# Patient Record
Sex: Male | Born: 1995
Health system: Southern US, Community
[De-identification: ages and names within clinical notes are randomized; demographics above are authoritative.]

## PROBLEM LIST (undated history)

## (undated) DIAGNOSIS — F845 Asperger's syndrome: Secondary | ICD-10-CM

## (undated) DIAGNOSIS — Z9989 Dependence on other enabling machines and devices: Secondary | ICD-10-CM

## (undated) DIAGNOSIS — R569 Unspecified convulsions: Secondary | ICD-10-CM

## (undated) DIAGNOSIS — G4733 Obstructive sleep apnea (adult) (pediatric): Secondary | ICD-10-CM

## (undated) DIAGNOSIS — R7303 Prediabetes: Secondary | ICD-10-CM

## (undated) DIAGNOSIS — F319 Bipolar disorder, unspecified: Secondary | ICD-10-CM

## (undated) DIAGNOSIS — F840 Autistic disorder: Secondary | ICD-10-CM

## (undated) HISTORY — PX: ADENOIDECTOMY: SUR15

## (undated) HISTORY — DX: Obstructive sleep apnea (adult) (pediatric): G47.33

## (undated) HISTORY — DX: Dependence on other enabling machines and devices: Z99.89

## (undated) HISTORY — DX: Prediabetes: R73.03

## (undated) HISTORY — PX: INNER EAR SURGERY: SHX679

---
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
## 2013-05-09 ENCOUNTER — Emergency Department (HOSPITAL_COMMUNITY)
Admission: EM | Admit: 2013-05-09 | Discharge: 2013-05-10 | Disposition: A | Discharge status: Home / Self Care | Payer: No Typology Code available for payment source | Attending: Emergency Medicine | Admitting: Emergency Medicine

## 2013-05-09 ENCOUNTER — Emergency Department (HOSPITAL_COMMUNITY): Payer: No Typology Code available for payment source

## 2013-05-09 ENCOUNTER — Encounter (HOSPITAL_COMMUNITY): Payer: Self-pay | Admitting: Adult Health

## 2013-05-09 DIAGNOSIS — Y92410 Unspecified street and highway as the place of occurrence of the external cause: Secondary | ICD-10-CM | POA: Insufficient documentation

## 2013-05-09 DIAGNOSIS — S5010XA Contusion of unspecified forearm, initial encounter: Secondary | ICD-10-CM | POA: Insufficient documentation

## 2013-05-09 DIAGNOSIS — F840 Autistic disorder: Secondary | ICD-10-CM | POA: Insufficient documentation

## 2013-05-09 DIAGNOSIS — F319 Bipolar disorder, unspecified: Secondary | ICD-10-CM | POA: Insufficient documentation

## 2013-05-09 DIAGNOSIS — S5011XA Contusion of right forearm, initial encounter: Secondary | ICD-10-CM

## 2013-05-09 DIAGNOSIS — Y9389 Activity, other specified: Secondary | ICD-10-CM | POA: Insufficient documentation

## 2013-05-09 DIAGNOSIS — R569 Unspecified convulsions: Secondary | ICD-10-CM | POA: Insufficient documentation

## 2013-05-09 DIAGNOSIS — Z79899 Other long term (current) drug therapy: Secondary | ICD-10-CM | POA: Insufficient documentation
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
## 2013-05-10 MED ORDER — HYDROCODONE-ACETAMINOPHEN 5-325 MG PO TABS: 1.0000 | ORAL_TABLET | ORAL | Status: DC | PRN

## 2013-05-10 MED ORDER — OXYCODONE-ACETAMINOPHEN 5-325 MG PO TABS
2.0000 | ORAL_TABLET | Freq: Once | ORAL | Status: AC
  Administered 2013-05-10: 2 via ORAL
  Filled 2013-05-10: qty 2
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
## 2013-05-11 NOTE — ED Provider Notes (Signed)
Medical screening examination/treatment/procedure(s) were performed by non-physician practitioner and as supervising physician I was immediately available for consultation/collaboration.   Laray Anger, DO 05/11/13 2019

## 2014-09-10 ENCOUNTER — Ambulatory Visit: Payer: 59 | Admitting: Diagnostic Neuroimaging

## 2014-10-30 ENCOUNTER — Emergency Department (HOSPITAL_BASED_OUTPATIENT_CLINIC_OR_DEPARTMENT_OTHER): Payer: 59

## 2014-10-30 ENCOUNTER — Encounter (HOSPITAL_BASED_OUTPATIENT_CLINIC_OR_DEPARTMENT_OTHER): Payer: Self-pay | Admitting: Emergency Medicine

## 2014-10-30 ENCOUNTER — Emergency Department (HOSPITAL_BASED_OUTPATIENT_CLINIC_OR_DEPARTMENT_OTHER)
Admission: EM | Admit: 2014-10-30 | Discharge: 2014-10-30 | Disposition: A | Discharge status: Home / Self Care | Payer: 59 | Attending: Emergency Medicine | Admitting: Emergency Medicine

## 2014-10-30 DIAGNOSIS — F840 Autistic disorder: Secondary | ICD-10-CM | POA: Diagnosis not present

## 2014-10-30 DIAGNOSIS — Z79899 Other long term (current) drug therapy: Secondary | ICD-10-CM | POA: Diagnosis not present

## 2014-10-30 DIAGNOSIS — R0781 Pleurodynia: Secondary | ICD-10-CM | POA: Insufficient documentation

## 2014-10-30 DIAGNOSIS — R569 Unspecified convulsions: Secondary | ICD-10-CM

## 2014-10-30 DIAGNOSIS — R55 Syncope and collapse: Secondary | ICD-10-CM | POA: Insufficient documentation

## 2014-10-30 DIAGNOSIS — R4020 Unspecified coma: Secondary | ICD-10-CM

## 2014-10-30 DIAGNOSIS — G40909 Epilepsy, unspecified, not intractable, without status epilepticus: Secondary | ICD-10-CM | POA: Insufficient documentation

## 2014-10-30 DIAGNOSIS — F319 Bipolar disorder, unspecified: Secondary | ICD-10-CM | POA: Insufficient documentation

## 2014-10-30 HISTORY — DX: Asperger's syndrome: F84.5

## 2014-10-30 LAB — URINALYSIS, ROUTINE W REFLEX MICROSCOPIC
Bilirubin Urine: NEGATIVE
Glucose, UA: NEGATIVE mg/dL
Hgb urine dipstick: NEGATIVE
Ketones, ur: NEGATIVE mg/dL
LEUKOCYTES UA: NEGATIVE
Nitrite: NEGATIVE
PROTEIN: NEGATIVE mg/dL
Specific Gravity, Urine: 1.008 (ref 1.005–1.030)
UROBILINOGEN UA: 0.2 mg/dL (ref 0.0–1.0)
pH: 7 (ref 5.0–8.0)

## 2014-10-30 LAB — RAPID URINE DRUG SCREEN, HOSP PERFORMED
AMPHETAMINES: NOT DETECTED
BENZODIAZEPINES: NOT DETECTED
Barbiturates: NOT DETECTED
COCAINE: NOT DETECTED
Opiates: NOT DETECTED
Tetrahydrocannabinol: NOT DETECTED

## 2014-10-30 LAB — COMPREHENSIVE METABOLIC PANEL
ALT: 18 U/L (ref 0–53)
AST: 23 U/L (ref 0–37)
Albumin: 4.4 g/dL (ref 3.5–5.2)
Alkaline Phosphatase: 78 U/L (ref 39–117)
Anion gap: 7 (ref 5–15)
BUN: 16 mg/dL (ref 6–23)
CALCIUM: 9.3 mg/dL (ref 8.4–10.5)
CHLORIDE: 104 meq/L (ref 96–112)
CO2: 27 mmol/L (ref 19–32)
Creatinine, Ser: 1.02 mg/dL (ref 0.50–1.35)
GFR calc Af Amer: >90 mL/min (ref >90)
Glucose, Bld: 117 mg/dL — ABNORMAL HIGH (ref 70–99)
Potassium: 3.5 mmol/L (ref 3.5–5.1)
SODIUM: 138 mmol/L (ref 135–145)
Total Bilirubin: 0.4 mg/dL (ref 0.3–1.2)
Total Protein: 7.6 g/dL (ref 6.0–8.3)

## 2014-10-30 LAB — CBC WITH DIFFERENTIAL/PLATELET
BASOS ABS: 0 10*3/uL (ref 0.0–0.1)
Basophils Relative: 0 % (ref 0–1)
EOS PCT: 1 % (ref 0–5)
Eosinophils Absolute: 0.1 10*3/uL (ref 0.0–0.7)
HCT: 41.9 % (ref 39.0–52.0)
Hemoglobin: 13.9 g/dL (ref 13.0–17.0)
LYMPHS PCT: 18 % (ref 12–46)
Lymphs Abs: 1.6 10*3/uL (ref 0.7–4.0)
MCH: 26.9 pg (ref 26.0–34.0)
MCHC: 33.2 g/dL (ref 30.0–36.0)
MCV: 81 fL (ref 78.0–100.0)
Monocytes Absolute: 0.6 10*3/uL (ref 0.1–1.0)
Monocytes Relative: 7 % (ref 3–12)
NEUTROS ABS: 6.3 10*3/uL (ref 1.7–7.7)
Neutrophils Relative %: 74 % (ref 43–77)
PLATELETS: 227 10*3/uL (ref 150–400)
RBC: 5.17 MIL/uL (ref 4.22–5.81)
RDW: 13.7 % (ref 11.5–15.5)
WBC: 8.6 10*3/uL (ref 4.0–10.5)

## 2014-10-30 LAB — CBG MONITORING, ED: Glucose-Capillary: 119 mg/dL — ABNORMAL HIGH (ref 70–99)

## 2014-10-30 LAB — ETHANOL: Alcohol, Ethyl (B): <5 mg/dL (ref 0–9)

## 2014-10-30 LAB — PHENYTOIN LEVEL, TOTAL: PHENYTOIN LVL: 3 ug/mL — AB (ref 10.0–20.0)

## 2014-10-30 MED ORDER — SODIUM CHLORIDE 0.9 % IV SOLN: 1000.0000 mg | Freq: Once | INTRAVENOUS | Status: DC

## 2014-10-30 MED ORDER — PHENYTOIN SODIUM 50 MG/ML IJ SOLN
INTRAMUSCULAR | Status: AC
  Administered 2014-10-30: 1000 mg
  Filled 2014-10-30: qty 20

## 2014-10-30 NOTE — ED Notes (Signed)
Pt ambulated in hallway without difficulty

## 2014-10-30 NOTE — Discharge Instructions (Signed)
Epilepsy Take your medications as prescribed. Follow-up with your doctor. Return to the ED if you develop new or worsening symptoms. Epilepsy is a disorder in which a person has repeated seizures over time. A seizure is a release of abnormal electrical activity in the brain. Seizures can cause a change in attention, behavior, or the ability to remain awake and alert (altered mental status). Seizures often involve uncontrollable shaking (convulsions).  Most people with epilepsy lead normal lives. However, people with epilepsy are at an increased risk of falls, accidents, and injuries. Therefore, it is important to begin treatment right away. CAUSES  Epilepsy has many possible causes. Anything that disturbs the normal pattern of brain cell activity can lead to seizures. This may include:   Head injury.  Birth trauma.  High fever as a child.  Stroke.  Bleeding into or around the brain.  Certain drugs.  Prolonged low oxygen, such as what occurs after CPR efforts.  Abnormal brain development.  Certain illnesses, such as meningitis, encephalitis (brain infection), malaria, and other infections.  An imbalance of nerve signaling chemicals (neurotransmitters).  SIGNS AND SYMPTOMS  The symptoms of a seizure can vary greatly from one person to another. Right before a seizure, you may have a warning (aura) that a seizure is about to occur. An aura may include the following symptoms:  Fear or anxiety.  Nausea.  Feeling like the room is spinning (vertigo).  Vision changes, such as seeing flashing lights or spots. Common symptoms during a seizure include:  Abnormal sensations, such as an abnormal smell or a bitter taste in the mouth.   Sudden, general body stiffness.   Convulsions that involve rhythmic jerking of the face, arm, or leg on one or both sides.   Sudden change in consciousness.   Appearing to be awake but not responding.   Appearing to be asleep but cannot be  awakened.   Grimacing, chewing, lip smacking, drooling, tongue biting, or loss of bowel or bladder control. After a seizure, you may feel sleepy for a while. DIAGNOSIS  Your health care provider will ask about your symptoms and take a medical history. Descriptions from any witnesses to your seizures will be very helpful in the diagnosis. A physical exam, including a detailed neurological exam, is necessary. Various tests may be done, such as:   An electroencephalogram (EEG). This is a painless test of your brain waves. In this test, a diagram is created of your brain waves. These diagrams can be interpreted by a specialist.  An MRI of the brain.   A CT scan of the brain.   A spinal tap (lumbar puncture, LP).  Blood tests to check for signs of infection or abnormal blood chemistry. TREATMENT  There is no cure for epilepsy, but it is generally treatable. Once epilepsy is diagnosed, it is important to begin treatment as soon as possible. For most people with epilepsy, seizures can be controlled with medicines. The following may also be used:  A pacemaker for the brain (vagus nerve stimulator) can be used for people with seizures that are not well controlled by medicine.  Surgery on the brain. For some people, epilepsy eventually goes away. HOME CARE INSTRUCTIONS   Follow your health care provider's recommendations on driving and safety in normal activities.  Get enough rest. Lack of sleep can cause seizures.  Only take over-the-counter or prescription medicines as directed by your health care provider. Take any prescribed medicine exactly as directed.  Avoid any known triggers of your  seizures.  Keep a seizure diary. Record what you recall about any seizure, especially any possible trigger.   Make sure the people you live and work with know that you are prone to seizures. They should receive instructions on how to help you. In general, a witness to a seizure should:   Cushion  your head and body.   Turn you on your side.   Avoid unnecessarily restraining you.   Not place anything inside your mouth.   Call for emergency medical help if there is any question about what has occurred.   Follow up with your health care provider as directed. You may need regular blood tests to monitor the levels of your medicine.  SEEK MEDICAL CARE IF:   You develop signs of infection or other illness. This might increase the risk of a seizure.   You seem to be having more frequent seizures.   Your seizure pattern is changing.  SEEK IMMEDIATE MEDICAL CARE IF:   You have a seizure that does not stop after a few moments.   You have a seizure that causes any difficulty in breathing.   You have a seizure that results in a very severe headache.   You have a seizure that leaves you with the inability to speak or use a part of your body.  Document Released: 10/26/2005 Document Revised: 08/16/2013 Document Reviewed: 06/07/2013 [facility] CenterExitCare Patient Information 2015 [employer], [place]. This information is not intended to replace advice given to you by your health care provider. Make sure you discuss any questions you have with your health care provider.

## 2014-10-30 NOTE — ED Notes (Signed)
RN at bedside

## 2014-10-30 NOTE — ED Notes (Signed)
Patient ambulates around RN station without difficulty or assistance. 

## 2014-10-30 NOTE — ED Provider Notes (Signed)
This chart was scribed for Richard OctaveStephen Hayven [name], MD by Richard PayorSonum Ellison, ED Scribe. This patient was seen in room MH09/MH09 and the patient's care was started 7:07 AM.    Chief Complaint  Patient presents with  . Syncope    The history is provided by the patient. No language interpreter was used.     HPI Comments: Richard Ellison is a 18 y.o. male with past medical history of seizures and Asperger's syndrome who presents to the Emergency Department complaining of a possible syncopal episode that occurred earlier this morning. Patient states he woke with right sided rib pain and ambulated to his mother's bedroom. He states having poor vision and a right sided frontal HA prior to falling onto his mother's bed. Patient states he has not had Lamictal or Dilantin for the past 2-3 days. He denies fever, cough, urinary incontinence, tongue biting, SOB.   Past Medical History  Diagnosis Date  . Bipolar 1 disorder   . Autism   . Seizures   . Aspergers' syndrome    Past Surgical History  Procedure Laterality Date  . Inner ear surgery     History reviewed. No pertinent family history. History  Substance Use Topics  . Smoking status: Never Smoker   . Smokeless tobacco: Never Used  . Alcohol Use: No    Review of Systems  A complete 10 system review of systems was obtained and all systems are negative except as noted in the HPI and PMH.    Allergies  Review of patient's allergies indicates no known allergies.  Home Medications   Prior to Admission medications   Medication Sig Start Date End Date Taking? Authorizing Provider  FLUoxetine (PROZAC) 20 MG capsule Take 20 mg by mouth daily.   Yes Historical Provider, MD  Lurasidone HCl (LATUDA PO) Take 20 mg by mouth at bedtime.    Yes Historical Provider, MD  QUEtiapine Fumarate (SEROQUEL XR PO) Take 200 mg by mouth at bedtime.    Yes Historical Provider, MD  HYDROcodone-acetaminophen (NORCO) 5-325 MG per tablet Take 1 tablet by mouth every 4 (four)  hours as needed for pain. 05/10/13   Angus SellerPeter S Dammen, PA-C  lamoTRIgine (LAMICTAL) 100 MG tablet Take 100 mg by mouth 2 (two) times daily.    Historical Provider, MD  phenytoin (DILANTIN) 100 MG ER capsule Take 200 mg by mouth at bedtime.    Historical Provider, MD   BP 122/65 mmHg  Pulse 64  Temp(Src) 98.4 F (36.9 C) (Oral)  Resp 18  Ht 6\' 1"  (1.854 m)  Wt 218 lb (98.884 kg)  BMI 28.77 kg/m2  SpO2 100% Physical Exam  Constitutional: He is oriented to person, place, and time. He appears well-developed and well-nourished. No distress.  Sleepy but arousable  HENT:  Head: Normocephalic and atraumatic.  Mouth/Throat: Oropharynx is clear and moist. No oropharyngeal exudate.  Eyes: Conjunctivae and EOM are normal. Pupils are equal, round, and reactive to light.  Neck: Normal range of motion. Neck supple.  No meningismus. No C spine tenderness   Cardiovascular: Normal rate, regular rhythm, normal heart sounds and intact distal pulses.   No murmur heard. Pulmonary/Chest: Effort normal and breath sounds normal. No respiratory distress. He exhibits tenderness ( Right anterior rib tenderness).  Abdominal: Soft. There is no tenderness. There is no rebound and no guarding.  Musculoskeletal: Normal range of motion. He exhibits no edema or tenderness.  No T or L spine pain  Neurological: He is alert and oriented to person, place, and  time. No cranial nerve deficit. He exhibits normal muscle tone. Coordination normal.  No ataxia on finger to nose bilaterally. No pronator drift. 5/5 strength throughout. CN 2-12 intact. Equal grip strength. Sensation intact.  Skin: Skin is warm.  Psychiatric: He has a normal mood and affect. His behavior is normal.  Nursing note and vitals reviewed.   ED Course  Procedures (including critical care time)  DIAGNOSTIC STUDIES: Oxygen Saturation is 99% on RA, normal. by my interpretation.    COORDINATION OF CARE: 7:16 AM Will order CXR, head CT, and lab work.  Discussed treatment plan with pt at bedside and pt agreed to plan.   Labs Review Labs Reviewed  COMPREHENSIVE METABOLIC PANEL - Abnormal; Notable for the following:    Glucose, Bld 117 (*)    All other components within normal limits  PHENYTOIN LEVEL, TOTAL - Abnormal; Notable for the following:    Phenytoin Lvl 3.0 (*)    All other components within normal limits  CBG MONITORING, ED - Abnormal; Notable for the following:    Glucose-Capillary 119 (*)    All other components within normal limits  URINALYSIS, ROUTINE W REFLEX MICROSCOPIC  CBC WITH DIFFERENTIAL  ETHANOL  URINE RAPID DRUG SCREEN (HOSP PERFORMED)    Imaging Review Dg Chest 2 View  10/30/2014   CLINICAL DATA:  Syncope and right-sided rib pain  EXAM: CHEST  2 VIEW  COMPARISON:  September 07, 2011  FINDINGS: Lungs are clear. The heart size and pulmonary vascularity are normal. No adenopathy. No pneumothorax. No bone lesions.  IMPRESSION: No abnormality noted.   Electronically Signed   By: Bretta Bang M.D.   On: 10/30/2014 07:47   Ct Head Wo Contrast  10/30/2014   CLINICAL DATA:  Syncope.  History of seizures  EXAM: CT HEAD WITHOUT CONTRAST  TECHNIQUE: Contiguous axial images were obtained from the base of the skull through the vertex without intravenous contrast.  COMPARISON:  January 22, 2010  FINDINGS: The ventricles are normal in size and configuration. There is no intracranial mass, hemorrhage, extra-axial fluid collection, or midline shift. Gray-white compartments appear normal. No acute infarct apparent. Bony calvarium appears intact. The mastoid air cells are clear.  IMPRESSION: Study within normal limits.   Electronically Signed   By: Bretta Bang M.D.   On: 10/30/2014 07:46     EKG Interpretation   Date/Time:  Tuesday October 30 2014 07:21:27 EST Ventricular Rate:  61 PR Interval:  180 QRS Duration: 88 QT Interval:  416 QTC Calculation: 418 R Axis:   66 Text Interpretation:  Normal sinus rhythm Normal  ECG No significant change  was found Confirmed by Manus Gunning  MD, Pricilla Moehle ([id number]) on 10/30/2014 7:31:05  AM      MDM   Final diagnoses:  LOC (loss of consciousness)  Rib pain on right side  Seizure   Patient with history of seizure disorder, noncompliance presenting with possible seizure. Awoke from sleep with right-sided rib pain and had syncopal episode witnessed by mother. Denies any tongue biting or incontinence. He was postictal for EMS. Now awake and alert 3.  EKG nsr.  No brugada or prolonged QT. Electrolytes normal Dilantin 3.0. Loading dose given. CT head negative.  CXR negative.  No tachycardia or hypoxia to suggest PE.  Patient alert and oriented 3. He has tolerated by mouth and ambulatory. He denies any dizziness, lightheadedness or headache. Rib pain has resolved.  Stressed compliance with medications. Follow up with PCP this week. He states he has all his  medications. Return precautions discussed.  BP 122/65 mmHg  Pulse 64  Temp(Src) 98.4 F (36.9 C) (Oral)  Resp 18  Ht 6\' 1"  (1.854 m)  Wt 218 lb (98.884 kg)  BMI 28.77 kg/m2  SpO2 100%  I personally performed the services described in this documentation, which was scribed in my presence. The recorded information has been reviewed and is accurate.   Richard OctaveStephen Sparrow Sanzo, MD 10/30/14 [phone]

## 2014-10-30 NOTE — ED Notes (Signed)
Pt reports syncopal episode after waking from sleep  And ambulating to mothers room to report right rib pain and had syncopal episode wittnessed by mother.

## 2014-10-30 NOTE — ED Notes (Signed)
MD at bedside. 

## 2015-12-31 MED FILL — QUETIAPINE FUMARATE 100 MG: 100 | 30 days supply | Qty: 60 | Fill #0

## 2015-12-31 MED FILL — FLUoxetine HCL 20 MG CAPS: 20 | 30 days supply | Qty: 30 | Fill #0 | Status: TO

## 2015-12-31 MED FILL — PHENYTOIN SOD EXT 100 MG CA: 100 | 30 days supply | Qty: 60 | Fill #0

## 2015-12-31 MED FILL — LATUDA 20 MG TABLET: 20 | 30 days supply | Qty: 60 | Fill #0

## 2016-02-27 MED FILL — LATUDA 20 MG TABLET: 20 | 30 days supply | Qty: 60 | Fill #1

## 2016-02-27 MED FILL — FLUoxetine HCL 20 MG CAPS: 20 | 30 days supply | Qty: 30 | Fill #1 | Status: TO

## 2016-02-27 MED FILL — QUETIAPINE FUMARATE 100 MG: 100 | 30 days supply | Qty: 60 | Fill #1

## 2016-02-27 MED FILL — PHENYTOIN SOD EXT 100 MG CA: 100 | 30 days supply | Qty: 60 | Fill #1

## 2017-01-02 ENCOUNTER — Encounter (HOSPITAL_BASED_OUTPATIENT_CLINIC_OR_DEPARTMENT_OTHER): Payer: Self-pay | Admitting: Adult Health

## 2017-01-02 ENCOUNTER — Emergency Department (HOSPITAL_BASED_OUTPATIENT_CLINIC_OR_DEPARTMENT_OTHER)
Admission: EM | Admit: 2017-01-02 | Discharge: 2017-01-02 | Disposition: A | Discharge status: Home / Self Care | Payer: Medicaid Other | Attending: Emergency Medicine | Admitting: Emergency Medicine

## 2017-01-02 DIAGNOSIS — R443 Hallucinations, unspecified: Secondary | ICD-10-CM | POA: Insufficient documentation

## 2017-01-02 DIAGNOSIS — F840 Autistic disorder: Secondary | ICD-10-CM | POA: Insufficient documentation

## 2017-01-02 DIAGNOSIS — Z760 Encounter for issue of repeat prescription: Secondary | ICD-10-CM | POA: Diagnosis not present

## 2017-01-02 DIAGNOSIS — Z79899 Other long term (current) drug therapy: Secondary | ICD-10-CM | POA: Diagnosis not present

## 2017-01-02 DIAGNOSIS — R440 Auditory hallucinations: Secondary | ICD-10-CM | POA: Insufficient documentation

## 2017-01-02 MED ORDER — LAMOTRIGINE 100 MG PO TABS: 100.0000 mg | ORAL_TABLET | Freq: Two times a day (BID) | ORAL | 0 refills | Status: DC

## 2017-01-02 MED ORDER — FLUOXETINE HCL 20 MG PO CAPS: 20.0000 mg | ORAL_CAPSULE | Freq: Every day | ORAL | 0 refills | Status: DC

## 2017-01-02 MED ORDER — LURASIDONE HCL 20 MG PO TABS: 20.0000 mg | ORAL_TABLET | Freq: Every day | ORAL | 0 refills | Status: DC

## 2017-01-02 MED ORDER — QUETIAPINE FUMARATE ER 200 MG PO TB24: 200.0000 mg | ORAL_TABLET | Freq: Every day | ORAL | 0 refills | Status: DC

## 2017-01-02 MED ORDER — PHENYTOIN SODIUM EXTENDED 100 MG PO CAPS: 200.0000 mg | ORAL_CAPSULE | Freq: Every day | ORAL | 0 refills | Status: DC

## 2017-01-02 NOTE — ED Triage Notes (Signed)
PResents requesting refills on Fluoxetine, phenytoin, latuda and seroqual and lamictal. PEr mother child recently had a change of insurance and they are waiting on his medicaid to kick in. His clinic had to change and now the new clinic will not see him and refill medications. He has been out for almost a month and is experciing headches and panic attacks. Mother afraid he will have seizures.

## 2017-01-02 NOTE — ED Notes (Signed)
Mother states pt has been out of his meds x 1 month. Starting to have some concerning s/s such as H/A, abd pain and panic attacks. Neuro WNL at present.

## 2017-01-02 NOTE — Discharge Instructions (Signed)
Read the information below.  I have provided the refills of your medications. Please take as directed.  Please call and schedule a follow up appointment with your primary provider.  Use the prescribed medication as directed.  Please discuss all new medications with your pharmacist.   You may return to the Emergency Department at any time for worsening condition or any new symptoms that concern you. Return if develop chest pain, shortness of breath, abdominal pain, seizure, suicidal/homicidal thoughts, neurologic symptoms, or any other new/concerning symptoms.

## 2017-01-02 NOTE — ED Provider Notes (Signed)
[facility]   CSN: [id number] Arrival date & time: 01/02/17  1408     History   Chief Complaint Chief Complaint  Patient presents with  . Medication Refill    HPI Richard Ellison is a 21 y.o. male.  Richard Ellison is a 21 y.o. male with h/o Autism, Bipolar 1, seizures, depression, anxiety presents to ED with mother for medication refill. Patient is currently in need of Prozac, Lamictal, Latuda, Dilantin, and Seroquel. Has not had medication from approximately one month due to change in insurance and primary provider no longer able to see. [facility]. Reports intermittent panic attacks, headaches, abdominal discomfort over the last month since not being on medication; however, currently denies any symptoms at this time. Denies fever, chest pain, shortness of breath, abdominal pain, seizures, loss of consciousness. Reports some intermittent visual and auditory hallucinations, stating he "hears and sees things from a distance," denies voices tell him to hurt himself or others. Denies suicidal ideation or homicidal ideation.       Past Medical History:  Diagnosis Date  . Aspergers' syndrome   . Autism   . Bipolar 1 disorder (HCC)   . Seizures (HCC)     There are no active problems to display for this patient.   Past Surgical History:  Procedure Laterality Date  . INNER EAR SURGERY         Home Medications    Prior to Admission medications   Medication Sig Start Date End Date Taking? Authorizing Provider  FLUoxetine (PROZAC) 20 MG capsule Take 1 capsule (20 mg total) by mouth daily. 01/02/17   Lona KettleAshley Laurel Natasha Burda, PA-C  HYDROcodone-acetaminophen (NORCO) 5-325 MG per tablet Take 1 tablet by mouth every 4 (four) hours as needed for pain. 05/10/13   Ivonne AndrewPeter Dammen, PA-C  lamoTRIgine (LAMICTAL) 100 MG tablet Take 1 tablet (100 mg total) by mouth 2 (two) times daily. 01/02/17 01/16/17  Lona KettleAshley Laurel Reita Shindler, PA-C    lurasidone (LATUDA) 20 MG TABS tablet Take 1 tablet (20 mg total) by mouth at bedtime. 01/02/17 01/16/17  Lona KettleAshley Laurel Keysean Savino, PA-C  phenytoin (DILANTIN) 100 MG ER capsule Take 2 capsules (200 mg total) by mouth at bedtime. 01/02/17 01/16/17  Lona KettleAshley Laurel Utah Delauder, PA-C  QUEtiapine (SEROQUEL XR) 200 MG 24 hr tablet Take 1 tablet (200 mg total) by mouth at bedtime. 01/02/17 01/16/17  Lona KettleAshley Laurel Jeremia Groot, PA-C    Family History History reviewed. No pertinent family history.  Social History Social History  Substance Use Topics  . Smoking status: Never Smoker  . Smokeless tobacco: Never Used  . Alcohol use No     Allergies   Patient has no known allergies.   Review of Systems Review of Systems  Constitutional: Negative for fever.  HENT: Negative for trouble swallowing.   Eyes: Negative for visual disturbance.  Respiratory: Negative for shortness of breath.   Cardiovascular: Negative for chest pain.  Gastrointestinal: Negative for abdominal pain, nausea and vomiting.  Musculoskeletal: Negative for arthralgias.  Skin: Negative for rash.  Neurological: Negative for syncope.  Psychiatric/Behavioral: Positive for hallucinations. Negative for suicidal ideas.     Physical Exam Updated Vital Signs BP 113/66 (BP Location: Right Arm)   Pulse (!) 58   Temp 98.4 F (36.9 C) (Oral)   Resp 18   Ht 6\' 2"  (1.88 m)   Wt 112.9 kg   SpO2 100%   BMI 31.97 kg/m   Physical Exam  Constitutional:  He appears well-developed and well-nourished. No distress.  HENT:  Head: Normocephalic and atraumatic.  Eyes: Conjunctivae and EOM are normal. Pupils are equal, round, and reactive to light. Right eye exhibits no discharge. Left eye exhibits no discharge. No scleral icterus.  Neck: Normal range of motion. Neck supple.  Cardiovascular: Regular rhythm, normal heart sounds and intact distal pulses.  Bradycardia present.   No murmur heard. Pulmonary/Chest: Effort normal and breath sounds normal. No  respiratory distress. He has no wheezes.  Abdominal: Soft. He exhibits no distension. There is no tenderness. There is no rigidity, no rebound and no guarding.  Musculoskeletal: Normal range of motion.  Neurological: He is alert. He has normal strength. No sensory deficit. Coordination normal. GCS eye subscore is 4. GCS verbal subscore is 5. GCS motor subscore is 6.  Mental Status: Alert, thought content appropriate, able to give a coherent history. Speech fluent without evidence of aphasia.  CN 2-12 grossly intact.  Moves extremities with ease. Sensation grossly equal and intact throughout. Strength 5/5 in all extremities.  Coordination nml with finger-to-nose b/l.   Skin: Skin is warm and dry. He is not diaphoretic.  Psychiatric: He has a normal mood and affect. His behavior is normal.     ED Treatments / Results  Labs (all labs ordered are listed, but only abnormal results are displayed) Labs Reviewed - No data to display  EKG  EKG Interpretation None       Radiology No results found.  Procedures Procedures (including critical care time)  Medications Ordered in ED Medications - No data to display   Initial Impression / Assessment and Plan / ED Course  I have reviewed the triage vital signs and the nursing notes.  Pertinent labs & imaging results that were available during my care of the patient were reviewed by me and considered in my medical decision making (see chart for details).     Patient presents to ED for medication refill. Change in insurance, awaiting to see new PCP, but in meantime has been out of medications for one month. Denies SI/HI. No other complaints or symptoms at this time. Patient is afebrile and non-toxic appearing in NAD. Bradycardia, vital signs otherwise stable. Lungs CTABL. Abdomen soft, non-tender. No focal neuro deficits on exam. Will refill rx. Follow up with new PCP ASAP for further medication management. Strict ED precautions given. Patient  and mother voiced understanding and are agreeable.   Final Clinical Impressions(s) / ED Diagnoses   Final diagnoses:  Medication refill    New Prescriptions Discharge Medication List as of 01/02/2017  5:24 PM       Lona Kettle, PA-C 01/02/17 [address] [place], [place] 01/02/17 2310    Alvira Monday, MD 01/03/17 1359

## 2017-01-02 NOTE — ED Notes (Signed)
Pt and mother given d/c instructions as per chart. Verbalizes understanding. No questions. Rx x 5

## 2017-03-02 ENCOUNTER — Other Ambulatory Visit: Payer: Self-pay | Admitting: Neurology

## 2017-03-02 DIAGNOSIS — G40309 Generalized idiopathic epilepsy and epileptic syndromes, not intractable, without status epilepticus: Principal | ICD-10-CM

## 2017-03-02 DIAGNOSIS — G40802 Other epilepsy, not intractable, without status epilepticus: Secondary | ICD-10-CM

## 2017-03-11 ENCOUNTER — Inpatient Hospital Stay (HOSPITAL_COMMUNITY): Admission: RE | Admit: 2017-03-11 | Payer: 59 | Source: Ambulatory Visit

## 2017-04-07 ENCOUNTER — Inpatient Hospital Stay (HOSPITAL_COMMUNITY): Admission: RE | Admit: 2017-04-07 | Payer: 59 | Source: Ambulatory Visit

## 2018-03-01 ENCOUNTER — Encounter (HOSPITAL_BASED_OUTPATIENT_CLINIC_OR_DEPARTMENT_OTHER): Payer: Self-pay | Admitting: *Deleted

## 2018-03-01 ENCOUNTER — Emergency Department (HOSPITAL_BASED_OUTPATIENT_CLINIC_OR_DEPARTMENT_OTHER)
Admission: EM | Admit: 2018-03-01 | Discharge: 2018-03-01 | Disposition: A | Discharge status: Home / Self Care | Payer: Medicaid Other | Attending: Emergency Medicine | Admitting: Emergency Medicine

## 2018-03-01 ENCOUNTER — Other Ambulatory Visit: Payer: Self-pay

## 2018-03-01 ENCOUNTER — Emergency Department (HOSPITAL_BASED_OUTPATIENT_CLINIC_OR_DEPARTMENT_OTHER): Payer: Medicaid Other

## 2018-03-01 DIAGNOSIS — S93402A Sprain of unspecified ligament of left ankle, initial encounter: Secondary | ICD-10-CM | POA: Insufficient documentation

## 2018-03-01 DIAGNOSIS — W109XXA Fall (on) (from) unspecified stairs and steps, initial encounter: Secondary | ICD-10-CM | POA: Diagnosis not present

## 2018-03-01 DIAGNOSIS — Y929 Unspecified place or not applicable: Secondary | ICD-10-CM | POA: Insufficient documentation

## 2018-03-01 DIAGNOSIS — S99912A Unspecified injury of left ankle, initial encounter: Secondary | ICD-10-CM | POA: Diagnosis present

## 2018-03-01 DIAGNOSIS — Z79899 Other long term (current) drug therapy: Secondary | ICD-10-CM | POA: Insufficient documentation

## 2018-03-01 DIAGNOSIS — R202 Paresthesia of skin: Secondary | ICD-10-CM | POA: Diagnosis not present

## 2018-03-01 DIAGNOSIS — Y998 Other external cause status: Secondary | ICD-10-CM | POA: Insufficient documentation

## 2018-03-01 DIAGNOSIS — Y9301 Activity, walking, marching and hiking: Secondary | ICD-10-CM | POA: Diagnosis not present

## 2018-03-01 DIAGNOSIS — F845 Asperger's syndrome: Secondary | ICD-10-CM | POA: Diagnosis not present

## 2018-03-01 MED ORDER — ACETAMINOPHEN 325 MG PO TABS: 650.0000 mg | ORAL_TABLET | Freq: Four times a day (QID) | ORAL | 0 refills | Status: DC | PRN

## 2018-03-01 MED ORDER — IBUPROFEN 400 MG PO TABS: 400.0000 mg | ORAL_TABLET | Freq: Four times a day (QID) | ORAL | 0 refills | Status: DC | PRN

## 2018-03-01 NOTE — Discharge Instructions (Signed)
You may alternate taking Tylenol and Ibuprofen as needed for pain control. You may take 400-600 mg of ibuprofen every 6 hours and [phone] mg of Tylenol every 6 hours. Do not exceed 4000 mg of Tylenol daily as this can lead to liver damage. Also, make sure to take Ibuprofen with meals as it can cause an upset stomach. Do not take other NSAIDs while taking Ibuprofen such as (Aleve, Naprosyn, Aspirin, Celebrex, etc) and do not take more than the prescribed dose as this can lead to ulcers and bleeding in your GI tract. You may use warm and cold compresses to help with your symptoms.   Please wear the splint that you were given in the ED for comfort. You may also use the crutches as well for comfort.  Please follow up with your primary doctor or the orthopedic doctor within the next 7-10 days for re-evaluation and further treatment of your symptoms.   Please return to the ER sooner if you have any new or worsening symptoms.

## 2018-03-01 NOTE — ED Triage Notes (Signed)
Left ankle injury. He was walking up steps this am and felt a pulling sensation then a twist.

## 2018-03-01 NOTE — ED Provider Notes (Signed)
[facility] EMERGENCY DEPARTMENT Provider Note   CSN: [id number] Arrival date & time: 03/01/18  1226     History   Chief Complaint Chief Complaint  Patient presents with  . Ankle Injury    HPI Richard Ellison is a 22 y.o. male.  HPI   Pt Is a 22 year old male who presents the ED today to be evaluated after a fall.  Patient states he was walking up the stairs and he was on the second step when his left foot got caught on the lip of the step and bent posteriorly.  States he then twisted his ankle and fell backwards onto his bottom and back.  Denies any head trauma or LOC.  Is complaining of pain to the left ankle that is severe and constant in nature.  Worse with movement.  Has not tried taking anything for the pain.  Denies any back pain.  No numbness or weakness to the legs.  Is reporting paresthesias to the left foot.  Denies any other injuries.  No chest pain or shortness of breath.  No abdominal pain, nausea, vomiting, diarrhea.  Past Medical History:  Diagnosis Date  . Aspergers' syndrome   . Autism   . Bipolar 1 disorder (HCC)   . Seizures (HCC)     There are no active problems to display for this patient.   Past Surgical History:  Procedure Laterality Date  . INNER EAR SURGERY          Home Medications    Prior to Admission medications   Medication Sig Start Date End Date Taking? Authorizing Provider  acetaminophen (TYLENOL) 325 MG tablet Take 2 tablets (650 mg total) by mouth every 6 (six) hours as needed. Do not take more than 4000mg  of tylenol per day 03/01/18   Adelaido Nicklaus S, PA-C  FLUoxetine (PROZAC) 20 MG capsule Take 1 capsule (20 mg total) by mouth daily. 01/02/17   Deborha Payment, PA-C  HYDROcodone-acetaminophen (NORCO) 5-325 MG per tablet Take 1 tablet by mouth every 4 (four) hours as needed for pain. 05/10/13   Ivonne Andrew, PA-C  ibuprofen (ADVIL,MOTRIN) 400 MG tablet Take 1 tablet (400 mg total) by mouth every 6 (six) hours as needed.  03/01/18   Rakwon Letourneau S, PA-C  lamoTRIgine (LAMICTAL) 100 MG tablet Take 1 tablet (100 mg total) by mouth 2 (two) times daily. 01/02/17 01/16/17  Deborha Payment, PA-C  lurasidone (LATUDA) 20 MG TABS tablet Take 1 tablet (20 mg total) by mouth at bedtime. 01/02/17 01/16/17  Deborha Payment, PA-C  phenytoin (DILANTIN) 100 MG ER capsule Take 2 capsules (200 mg total) by mouth at bedtime. 01/02/17 01/16/17  Deborha Payment, PA-C  QUEtiapine (SEROQUEL XR) 200 MG 24 hr tablet Take 1 tablet (200 mg total) by mouth at bedtime. 01/02/17 01/16/17  Deborha Payment, PA-C    Family History No family history on file.  Social History Social History   Tobacco Use  . Smoking status: Never Smoker  . Smokeless tobacco: Never Used  Substance Use Topics  . Alcohol use: No  . Drug use: No     Allergies   Patient has no known allergies.   Review of Systems Review of Systems  Musculoskeletal: Negative for back pain and neck pain.       Left ankle pain  Skin: Negative for wound.  Neurological: Negative for weakness and numbness.       Paresthesias to left foot/ankle, no head trauma or LOC  Physical Exam Updated Vital Signs BP 134/87   Pulse 90   Temp 98.1 F (36.7 C) (Oral)   Resp 16   Ht 6\' 2"  (1.88 m)   Wt 121.1 kg (267 lb)   SpO2 97%   BMI 34.28 kg/m   Physical Exam  Constitutional: He is oriented to person, place, and time. He appears well-developed and well-nourished. No distress.  Eyes: Conjunctivae are normal.  Neck: Neck supple.  Cardiovascular: Normal rate and regular rhythm.  Pulmonary/Chest: Effort normal and breath sounds normal.  Musculoskeletal:  DP pulses intact bilaterally.  Sensation intact to bilateral lower extremities.  Able to wiggle toes bilaterally brisk cap refill bilaterally to all toes.  Dorsiflexion and plantarflexion intact to the bilateral feet.  Patient has tenderness to the medial lateral malleoli as well as over the left medial midfoot.  Achilles tendon  intact bilaterally.  5/5 strength of bilateral lower extremities throughout.  No midline cervical, thoracic, or lumbar spine tenderness.  Neurological: He is alert and oriented to person, place, and time.  Skin: Skin is warm and dry. Capillary refill takes less than 2 seconds.  Psychiatric: He has a normal mood and affect.     ED Treatments / Results  Labs (all labs ordered are listed, but only abnormal results are displayed) Labs Reviewed - No data to display  EKG None  Radiology Dg Ankle Complete Left  Result Date: 03/01/2018 CLINICAL DATA:  Left ankle injury when walking up steps this morning when a twisting injury occurred. EXAM: LEFT ANKLE COMPLETE - 3+ VIEW COMPARISON:  Left foot series of today's date FINDINGS: The bones are subjectively adequately mineralized. The joint mortise is preserved. The talar dome is intact. The remainder of the talus as well as the calcaneus are intact. The distal tibia and fibula are unremarkable. There is mild soft tissue swelling anteriorly and laterally. IMPRESSION: There is no acute bony abnormality of the left ankle. Electronically Signed   By: David  Swazil[place]Jordan M.D.   On: 03/01/2018 13:21   Dg Foot Complete Left  Result Date: 03/01/2018 CLINICAL DATA:  Twisting injury of the foot and ankle while walking up steps this morning. EXAM: LEFT FOOT - COMPLETE 3+ VIEW COMPARISON:  Left ankle series of today's date FINDINGS: The bones of the foot are subjectively adequately mineralized. There is no acute fracture nor dislocation. 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 The joint spaces are well maintained. The metatarsals and tarsals are unremarkable. IMPRESSION: There is no acute or significant chronic bony abnormality of the left foot. Electronically Signed   By: David  [place] M.D.   On: 03/01/2018 13:22    Procedures Procedures (including critical care time) SPLINT APPLICATION Date/Time: 1:51 PM Authorized by: Karrie Meresortni S Angeli Demilio Consent: Verbal consent  obtained. Risks and benefits: risks, benefits and alternatives were discussed Consent given by: patient Splint applied by: nurse Location details: LLE Splint type: ASO splint Supplies used: ASO splint Post-procedure: The splinted body part was neurovascularly unchanged following the procedure. Patient tolerance: Patient tolerated the procedure well with no immediate complications.   Medications Ordered in ED Medications - No data to display   Initial Impression / Assessment and Plan / ED Course  I have reviewed the triage vital signs and the nursing notes.  Pertinent labs & imaging results that were available during my care of the patient were reviewed by me and considered in my medical decision making (see chart for details).    Final Clinical Impressions(s) / ED Diagnoses  Final diagnoses:  Sprain of left ankle, unspecified ligament, initial encounter   Patient presenting with ankle pain after twisting ankle prior to arrival.  Vital signs stable and patient nontoxic-appearing.  X-ray of left foot and ankle negative for acute fracture abnormality.  A splint was applied and crutches given.  OrthO follow-up given and patient advised to follow-up with either PCP or orthopedics in 1 week for reevaluation.  Advised Tylenol, ibuprofen, and rice protocol for pain.  Advised to return to the ER for any new or worsening symptoms in the meantime.  All questions were answered and patient understands plan and reasons to return.  ED Discharge Orders        Ordered    acetaminophen (TYLENOL) 325 MG tablet  Every 6 hours PRN     03/01/18 1301    ibuprofen (ADVIL,MOTRIN) 400 MG tablet  Every 6 hours PRN     03/01/18 1301       Oluwaseun Bruyere, [employer], PA-C 03/01/18 1351    Benjiman Core, MD 03/01/18 1538

## 2018-05-31 ENCOUNTER — Encounter: Payer: Self-pay | Admitting: Neurology

## 2018-05-31 ENCOUNTER — Ambulatory Visit: Payer: Medicaid Other | Admitting: Neurology

## 2018-05-31 VITALS — BP 147/82 | HR 61 | Ht 74.0 in | Wt 259.5 lb

## 2018-05-31 DIAGNOSIS — G40301 Generalized idiopathic epilepsy and epileptic syndromes, not intractable, with status epilepticus: Secondary | ICD-10-CM | POA: Diagnosis not present

## 2018-05-31 DIAGNOSIS — G40909 Epilepsy, unspecified, not intractable, without status epilepticus: Secondary | ICD-10-CM | POA: Insufficient documentation

## 2018-05-31 MED ORDER — LAMOTRIGINE 25 MG PO TABS: 25.0000 mg | ORAL_TABLET | Freq: Every day | ORAL | 0 refills | Status: DC

## 2018-05-31 MED ORDER — LAMOTRIGINE 150 MG PO TABS: 150.0000 mg | ORAL_TABLET | Freq: Two times a day (BID) | ORAL | 11 refills | Status: DC

## 2018-05-31 NOTE — Progress Notes (Signed)
PATIENT: Richard Ellison DOB: [birth year]  Chief Complaint  Patient presents with  . Seizures    He is here with his mother, Materials engineerDontruster.  He has not been under a neurologist care since Dr. Sharene SkeansHickling. He had his first seizure at two months old.  His last seizure occurred in 2016.  His mother states his Dilantin levels have been up and down, despite taking his medication as prescribed.    . PCP    Corbin AdeYbarra, Jeffrey S, NP     HISTORICAL  Richard Ellison is a 22 years old male, seen in request by his primary care nurse practitioner Quincy CarnesYbarra, Jeffrey for evaluation of seizure, is accompanied by his mother Richard Ellison at today's visit, initial evaluation was on May 31, 2018.  I reviewed and summarized the referring note, he had a history of schizoaffective disorder, bipolar type, obstructive sleep apnea, epilepsy, prediabetes, on polypharmacy treatment, this including Dilantin 200 mg three times a day, Seroquel 100 mg once daily, 200 mg at night, fluoxetine 20 mg in the morning, lamotrigine 100 mg twice a day, Latuda 20 mg 2 tablets at bedtime, Metformin 500 mg 1 tablets daily,  He had a history of seizures since infant 2 months old, previously worked under the care of pediatric neurologist Dr. Sharene SkeansHickling until 2014, he has generalized tonic-clonic seizure, also had seizures of staring, freezing up, then snapped out of it.  He been treated with stable dose of Dilantin 200 mg twice a day,  he has been on Dilantin 400 mg daily he since 2012, along with lamotrigine.  He has seizure every few months, last generalized tonic-clonic seizure was in 2016.  He recently switched to his primary care's office, who has followed up his Dilantin level, Dilantin dose was adjusted based on the level, up to 600 mg daily, while she was he was taking 200/400 mg of Dilantin, he shows signs of toxicity, imbalance, dizziness, level was 26, later the dosage was adjusted to 200 mg 3 times daily, lamotrigine 100 mg twice a day was  stopped since May 16, 2018,  He is now on single antiepileptic medications Dilantin ER 100 mg 2 tablets 3 times a day.  CT head without contrast in December 2015 was normal.  REVIEW OF SYSTEMS: Full 14 system review of systems performed and notable only for as above  ALLERGIES: No Known Allergies  HOME MEDICATIONS: Current Outpatient Medications  Medication Sig Dispense Refill  . FLUoxetine (PROZAC) 20 MG capsule Take 1 capsule (20 mg total) by mouth daily. 14 capsule 0  . phenytoin (DILANTIN) 100 MG ER capsule Take 200 mg by mouth 3 (three) times daily.    . QUEtiapine (SEROQUEL) 100 MG tablet Take 100 mg by mouth every morning.    . traZODone (DESYREL) 50 MG tablet TAKE ONE TABLET BY MOUTH AT BEDTIME FOR insomnia  2  . QUEtiapine (SEROQUEL XR) 200 MG 24 hr tablet Take 1 tablet (200 mg total) by mouth at bedtime. 14 tablet 0   No current facility-administered medications for this visit.     PAST MEDICAL HISTORY: Past Medical History:  Diagnosis Date  . Aspergers' syndrome   . Autism   . Bipolar 1 disorder (HCC)   . OSA on CPAP   . Pre-diabetes   . Seizures (HCC)     PAST SURGICAL HISTORY: Past Surgical History:  Procedure Laterality Date  . ADENOIDECTOMY    . INNER EAR SURGERY      FAMILY HISTORY: Family History  Problem  Relation Age of Onset  . Healthy Mother   . Other Father        unsure of health status    SOCIAL HISTORY: Social History   Socioeconomic History  . Marital status: Single    Spouse name: Not on file  . Number of children: 0  . Years of education: 58  . Highest education level: High school graduate  Occupational History  . Occupation: Disabled  Social Needs  . Financial resource strain: Not on file  . Food insecurity:    Worry: Not on file    Inability: Not on file  . Transportation needs:    Medical: Not on file    Non-medical: Not on file  Tobacco Use  . Smoking status: Never Smoker  . Smokeless tobacco: Never Used  Substance  and Sexual Activity  . Alcohol use: No  . Drug use: No  . Sexual activity: Not on file  Lifestyle  . Physical activity:    Days per week: Not on file    Minutes per session: Not on file  . Stress: Not on file  Relationships  . Social connections:    Talks on phone: Not on file    Gets together: Not on file    Attends religious service: Not on file    Active member of club or organization: Not on file    Attends meetings of clubs or organizations: Not on file    Relationship status: Not on file  . Intimate partner violence:    Fear of current or ex partner: Not on file    Emotionally abused: Not on file    Physically abused: Not on file    Forced sexual activity: Not on file  Other Topics Concern  . Not on file  Social History Narrative   Lives at home with his mother.   Left-handed.   Occasional caffeine use.     PHYSICAL EXAM   Vitals:   05/31/18 1032  BP: (!) 147/82  Pulse: 61  Weight: 259 lb 8 oz (117.7 kg)  Height: 6\' 2"  (1.88 m)    Not recorded      Body mass index is 33.32 kg/m.  PHYSICAL EXAMNIATION:  Gen: NAD, conversant, well nourised, obese, well groomed                     Cardiovascular: Regular rate rhythm, no peripheral edema, warm, nontender. Eyes: Conjunctivae clear without exudates or hemorrhage Neck: Supple, no carotid bruits. Pulmonary: Clear to auscultation bilaterally   NEUROLOGICAL EXAM:  MENTAL STATUS: Speech/ cognition: Mild slow, depend on his mother to provide history following two-step commands,   CRANIAL NERVES: CN II: Visual fields are full to confrontation.Pupils are round equal and briskly reactive to light. CN III, IV, VI: extraocular movement are normal. No ptosis. CN V: Facial sensation is intact to pinprick in all 3 divisions bilaterally. Corneal responses are intact.  CN VII: Face is symmetric with normal eye closure and smile. CN VIII: Hearing is normal to rubbing fingers CN IX, X: Palate elevates symmetrically.  Phonation is normal. CN XI: Head turning and shoulder shrug are intact CN XII: Tongue is midline with normal movements and no atrophy.  MOTOR: There is no pronator drift of out-stretched arms. Muscle bulk and tone are normal. Muscle strength is normal.  REFLEXES: Reflexes are 2+ and symmetric at the biceps, triceps, knees, and ankles. Plantar responses are flexor.  SENSORY: Intact to light touch, pinprick, positional sensation and vibratory  sensation are intact in fingers and toes.  COORDINATION: Rapid alternating movements and fine finger movements are intact. There is no dysmetria on finger-to-nose and heel-knee-shin.    GAIT/STANCE: Posture is normal. Gait is steady with normal steps, base, arm swing, and turning. Heel and toe walking are normal.  Mild difficulty perform tandem walking Romberg is absent.   DIAGNOSTIC DATA (LABS, IMAGING, TESTING) - I reviewed patient records, labs, notes, testing and imaging myself where available.   ASSESSMENT AND PLAN  Richard Ellison is a 22 y.o. male   Epilepsy  Mood diorder  EEG.  Lower dilantin ER 176m 2 tabs at night, titrating Lamotrigine to 150mg  bid.  Hope in the long term, change to lamotrigine in combination with Depakote.    Levert Feinstein, M.D. Ph.D.  [facility] [address] [place], [place] [address] Ph: [phone] Fax: [phone]  CC: Corbin Ade, NP

## 2018-05-31 NOTE — Patient Instructions (Signed)
Week Lamotrigine Dilantin 100mg   1st week 25mg  twice a day 2/2/2  2nd week 25mg x2 tab twice aday 2/2/2  3rd week 25mg x3tab twice aday 1/2/2  4th week 25mg x4 tab twice aday 2//2  5th week 25mg  x5 tabs twice a day 3 tabs at night  6th week Lamotrigine 150mg  twice a day 2 tabs at night

## 2018-06-01 ENCOUNTER — Ambulatory Visit: Payer: Medicaid Other | Admitting: Neurology

## 2018-06-01 DIAGNOSIS — G40301 Generalized idiopathic epilepsy and epileptic syndromes, not intractable, with status epilepticus: Secondary | ICD-10-CM

## 2018-06-06 ENCOUNTER — Telehealth: Payer: Self-pay | Admitting: Neurology

## 2018-06-06 NOTE — Telephone Encounter (Signed)
Called patient to set up 2 week follow-up per Dr. Terrace ArabiaYan. Patient has no voicemail and #'s on DPR also have no voicemail.

## 2018-06-15 NOTE — Procedures (Signed)
   HISTORY: 22 years old male, with history of seizure.  TECHNIQUE:  16 channel EEG was performed based on standard 10-16 international system. One channel was dedicated to EKG, which has demonstrates normal sinus rhythm of 72 beats per minutes.  Upon awakening, the posterior background activity was mildly dysrhythmic, in the theta range, 6 to 7 Hz, reactive to eye opening and closure.  There was no evidence of epileptiform discharge.  Photic stimulation was performed, which induced a symmetric photic driving.  Hyperventilation was performed, there was no abnormality elicit.  No sleep was achieved.  CONCLUSION: This is mild abnormal EEG.  There is mild background slowing, indicating mild bihemisphere malfunction, common etiology are metabolic toxic.  Levert FeinsteinYijun Caterina Racine, M.D. Ph.D.  [facility] [address] [place], [place] [address] Phone: [phone] Fax:      [phone]

## 2018-06-30 ENCOUNTER — Telehealth: Payer: Self-pay | Admitting: *Deleted

## 2018-06-30 ENCOUNTER — Ambulatory Visit: Payer: Medicaid Other | Admitting: Neurology

## 2018-06-30 NOTE — Telephone Encounter (Signed)
No show. Called to canceled 1pm follow up appt at 11:26am due to lack of transportation.

## 2018-06-30 NOTE — Telephone Encounter (Signed)
Pt c/a appt for today 8/22. Please call to get the pt in sooner than 10/14 which is 1st available  Thank you

## 2018-07-01 ENCOUNTER — Encounter: Payer: Self-pay | Admitting: Neurology

## 2018-07-04 NOTE — Telephone Encounter (Signed)
Pt's mother Dee/DPR  called back today to r/s the appt. Please call to advise at [phone]

## 2018-07-04 NOTE — Telephone Encounter (Signed)
Offered two different appts for this week. Unable to make due to lack of transportation.  She needs at least 4 days notice to be able [facility]  His appt has been changed to 07/18/18 at 11:30am.  She was instructed to have him arrive at 11am for check in.

## 2018-07-18 ENCOUNTER — Ambulatory Visit: Payer: Medicaid Other | Admitting: Neurology

## 2018-07-18 ENCOUNTER — Encounter: Payer: Self-pay | Admitting: Neurology

## 2018-07-18 VITALS — BP 129/78 | HR 75 | Ht 74.0 in | Wt 262.5 lb

## 2018-07-18 DIAGNOSIS — G40301 Generalized idiopathic epilepsy and epileptic syndromes, not intractable, with status epilepticus: Secondary | ICD-10-CM

## 2018-07-18 MED ORDER — LAMOTRIGINE ER 300 MG PO TB24: 300.0000 mg | ORAL_TABLET | Freq: Every day | ORAL | 4 refills | Status: DC

## 2018-07-18 NOTE — Progress Notes (Signed)
PATIENT: Richard Ellison DOB: October 29, 1996  Chief Complaint  Patient presents with  . Seizures    He is here with his mother, Materials engineer.  They would like to review his EEG results. She mixed up his Lamical instructions.  She was giving him both Lamictal 25mg  (following titration instructions) along with the Lamictal 150mg  BID.  He is also taking Dilantin ER 100mg , 2 capsules at bedtime.  He has not had any seizures.      HISTORICAL  Richard Ellison is a 22 years old male, seen in request by his primary care nurse practitioner Quincy Carnes for evaluation of seizure, is accompanied by his mother Richard Ellison at today's visit, initial evaluation was on May 31, 2018.  I reviewed and summarized the referring note, he had a history of schizoaffective disorder, bipolar type, obstructive sleep apnea, epilepsy, prediabetes, on polypharmacy treatment, this including Dilantin 200 mg three times a day, Seroquel 100 mg once daily, 200 mg at night, fluoxetine 20 mg in the morning, lamotrigine 100 mg twice a day, Latuda 20 mg 2 tablets at bedtime, Metformin 500 mg 1 tablets daily,  He had a history of seizures since infant 2 months old, previously was  under the care of pediatric neurologist Dr. Sharene Skeans until 2014, he has generalized tonic-clonic seizure, also had seizures of staring, freezing up, then snapped out of it.  He been treated with stable dose of Dilantin 200 mg twice a day,  he has been on Dilantin 400 mg daily since 2012, along with lamotrigine.  He has seizure every few months, last generalized tonic-clonic seizure was in 2016.  He recently switched to his primary care's office, who has followed up his Dilantin level, Dilantin dose was adjusted based on the level, up to 600 mg daily, while  he was he was taking 200/400 mg of Dilantin, he shows signs of toxicity, imbalance, dizziness, level was 26, later the dosage was adjusted to 200 mg 3 times daily, lamotrigine 100 mg twice a day was  stopped since May 16, 2018,  He is now on single antiepileptic medications Dilantin ER 100 mg 2 tablets 3 times a day.  CT head without contrast in December 2015 was normal.  UPDATE Sept 9 2019: EEG in July 2018 showed mild generalized slowing, indicating mild bihemisphere no function.  He is taking lamotrigine 150mg  bid mistakenly with lamotrigine 25 mg twice a day as well, dilantin dose was decreased to 176m 2 tabs qhs, he has no recurrent seizure, no longer has excessive daytime sleepiness, drowsiness,  He continues to suffer significant mood disorder, is receiving adjustment of his Seroquel dosage by his psychiatrist.   REVIEW OF SYSTEMS: Full 14 system review of systems performed and notable only for as above All rest of the review of the systems were negative.  ALLERGIES: No Known Allergies  HOME MEDICATIONS: Current Outpatient Medications  Medication Sig Dispense Refill  . FLUoxetine (PROZAC) 20 MG capsule Take 1 capsule (20 mg total) by mouth daily. 14 capsule 0  . lamoTRIgine (LAMICTAL) 150 MG tablet Take 1 tablet (150 mg total) by mouth 2 (two) times daily. 60 tablet 11  . phenytoin (DILANTIN) 100 MG ER capsule Take 200 mg by mouth 3 (three) times daily.    . QUEtiapine (SEROQUEL) 100 MG tablet Take 100 mg by mouth every morning.    . traZODone (DESYREL) 50 MG tablet TAKE ONE TABLET BY MOUTH AT BEDTIME FOR insomnia  2  . QUEtiapine (SEROQUEL XR) 200 MG 24  hr tablet Take 1 tablet (200 mg total) by mouth at bedtime. 14 tablet 0   No current facility-administered medications for this visit.     PAST MEDICAL HISTORY: Past Medical History:  Diagnosis Date  . Aspergers' syndrome   . Autism   . Bipolar 1 disorder (HCC)   . OSA on CPAP   . Pre-diabetes   . Seizures (HCC)     PAST SURGICAL HISTORY: Past Surgical History:  Procedure Laterality Date  . ADENOIDECTOMY    . INNER EAR SURGERY      FAMILY HISTORY: Family History  Problem Relation Age of Onset  .  Healthy Mother   . Other Father        unsure of health status    SOCIAL HISTORY: Social History   Socioeconomic History  . Marital status: Single    Spouse name: Not on file  . Number of children: 0  . Years of education: 71  . Highest education level: High school graduate  Occupational History  . Occupation: Disabled  Social Needs  . Financial resource strain: Not on file  . Food insecurity:    Worry: Not on file    Inability: Not on file  . Transportation needs:    Medical: Not on file    Non-medical: Not on file  Tobacco Use  . Smoking status: Never Smoker  . Smokeless tobacco: Never Used  Substance and Sexual Activity  . Alcohol use: No  . Drug use: No  . Sexual activity: Not on file  Lifestyle  . Physical activity:    Days per week: Not on file    Minutes per session: Not on file  . Stress: Not on file  Relationships  . Social connections:    Talks on phone: Not on file    Gets together: Not on file    Attends religious service: Not on file    Active member of club or organization: Not on file    Attends meetings of clubs or organizations: Not on file    Relationship status: Not on file  . Intimate partner violence:    Fear of current or ex partner: Not on file    Emotionally abused: Not on file    Physically abused: Not on file    Forced sexual activity: Not on file  Other Topics Concern  . Not on file  Social History Narrative   Lives at home with his mother.   Left-handed.   Occasional caffeine use.     PHYSICAL EXAM   Vitals:   07/18/18 1114  BP: 129/78  Pulse: 75  Weight: 262 lb 8 oz (119.1 kg)  Height: 6\' 2"  (1.88 m)    Not recorded      Body mass index is 33.7 kg/m.  PHYSICAL EXAMNIATION:  Gen: NAD, conversant, well nourised, obese, well groomed                     Cardiovascular: Regular rate rhythm, no peripheral edema, warm, nontender. Eyes: Conjunctivae clear without exudates or hemorrhage Neck: Supple, no carotid  bruits. Pulmonary: Clear to auscultation bilaterally   NEUROLOGICAL EXAM:  MENTAL STATUS: Speech/ cognition: Mild slow, depend on his mother to provide history following two-step commands,   CRANIAL NERVES: CN II: Visual fields are full to confrontation.Pupils are round equal and briskly reactive to light. CN III, IV, VI: extraocular movement are normal. No ptosis. CN V: Facial sensation is intact to pinprick in all 3 divisions bilaterally. Corneal  responses are intact.  CN VII: Face is symmetric with normal eye closure and smile. CN VIII: Hearing is normal to rubbing fingers CN IX, X: Palate elevates symmetrically. Phonation is normal. CN XI: Head turning and shoulder shrug are intact CN XII: Tongue is midline with normal movements and no atrophy.  MOTOR: There is no pronator drift of out-stretched arms. Muscle bulk and tone are normal. Muscle strength is normal.  REFLEXES: Reflexes are 2+ and symmetric at the biceps, triceps, knees, and ankles. Plantar responses are flexor.  SENSORY: Intact to light touch, pinprick, positional sensation and vibratory sensation are intact in fingers and toes.  COORDINATION: Rapid alternating movements and fine finger movements are intact. There is no dysmetria on finger-to-nose and heel-knee-shin.    GAIT/STANCE: Posture is normal. Gait is steady with normal steps, base, arm swing, and turning. Heel and toe walking are normal.  Mild difficulty perform tandem walking Romberg is absent.   DIAGNOSTIC DATA (LABS, IMAGING, TESTING) - I reviewed patient records, labs, notes, testing and imaging myself where available.   ASSESSMENT AND PLAN  JAYDDEN BEMBENEK is a 22 y.o. male   Epilepsy  Mood diorder  Will change lamotrigine to extended release 300 mg every night  Continue tapering off Dilantin.  Call clinic for recurrent seizure, if he has recurrent seizure will add on low-dose Depakote    Levert Feinstein, M.D. Ph.D.  [facility] [address], [address] [place], [place] [address] Ph: [phone] Fax: [phone]  CC: Corbin Ade, NP

## 2018-07-18 NOTE — Patient Instructions (Addendum)
Week Lamotrigine ER 300mg   Dilantin ER 132m  1st week  once at night One tab at night  2nd week Once at night Stop Dilantin    He should not take lamotrigine 25mg  tablet

## 2018-08-22 ENCOUNTER — Ambulatory Visit: Payer: Medicaid Other | Admitting: Neurology

## 2019-03-22 ENCOUNTER — Telehealth: Payer: Self-pay

## 2019-03-22 NOTE — Telephone Encounter (Signed)
Spoke with the patient and he has given verbal consent to do a doxy.me visit and to file his insurance. E-mail has been confirmed and sent.  E-mail: [email or web address]

## 2019-03-23 ENCOUNTER — Ambulatory Visit: Payer: Medicaid Other | Admitting: Adult Health

## 2019-03-23 ENCOUNTER — Ambulatory Visit (INDEPENDENT_AMBULATORY_CARE_PROVIDER_SITE_OTHER): Payer: Medicaid Other | Admitting: Neurology

## 2019-03-23 ENCOUNTER — Other Ambulatory Visit: Payer: Self-pay | Admitting: *Deleted

## 2019-03-23 ENCOUNTER — Other Ambulatory Visit: Payer: Self-pay

## 2019-03-23 ENCOUNTER — Encounter: Payer: Self-pay | Admitting: Neurology

## 2019-03-23 DIAGNOSIS — G40301 Generalized idiopathic epilepsy and epileptic syndromes, not intractable, with status epilepticus: Secondary | ICD-10-CM | POA: Diagnosis not present

## 2019-03-23 MED ORDER — FLUOXETINE HCL 20 MG PO CAPS: 20.0000 mg | ORAL_CAPSULE | Freq: Every day | ORAL | 0 refills | Status: DC

## 2019-03-23 MED ORDER — FLUOXETINE HCL 20 MG PO CAPS
20.0000 mg | ORAL_CAPSULE | Freq: Every day | ORAL | 0 refills | Status: DC
Start: 2019-03-23 — End: 2019-03-23

## 2019-03-23 MED ORDER — TRAZODONE HCL 50 MG PO TABS: ORAL_TABLET | ORAL | 0 refills | Status: DC

## 2019-03-23 MED ORDER — LAMOTRIGINE ER 300 MG PO TB24: 300.0000 mg | ORAL_TABLET | Freq: Every day | ORAL | 4 refills | Status: DC

## 2019-03-23 MED ORDER — QUETIAPINE FUMARATE 100 MG PO TABS: 100.0000 mg | ORAL_TABLET | Freq: Two times a day (BID) | ORAL | 0 refills | Status: DC

## 2019-03-23 NOTE — Progress Notes (Signed)
Virtual Visit via Video Note  I connected with Richard Ellison on 03/23/19 at  3:15 PM EDT by a video enabled telemedicine application and verified that I am speaking with the correct person using two identifiers.  Location: Patient: At his home Provider: At my home   I discussed the limitations of evaluation and management by telemedicine and the availability of in person appointments. The patient expressed understanding and agreed to proceed.  History of Present Illness: Richard Ellison is a 23 years old male, seen in request by his primary care nurse practitioner Richard Ellison for evaluation of seizure, is accompanied by his mother Richard Ellison at today's visit, initial evaluation was on May 31, 2018.  I reviewed and summarized the referring note, he had a history of schizoaffective disorder, bipolar type, obstructive sleep apnea, epilepsy, prediabetes, on polypharmacy treatment, this including Dilantin 200 mg three times a day, Seroquel 100 mg once daily, 200 mg at night, fluoxetine 20 mg in the morning, lamotrigine 100 mg twice a day, Latuda 20 mg 2 tablets at bedtime, Metformin 500 mg 1 tablets daily,  He had a history of seizures since infant 2 months old, previously was  under the care of pediatric neurologist Dr. Sharene Skeans until 2014, he has generalized tonic-clonic seizure, also had seizures of staring, freezing up, then snapped out of it.  He been treated with stable dose of Dilantin 200 mg twice a day,  he has been on Dilantin 400 mg daily since 2012, along with lamotrigine.  He has seizure every few months, last generalized tonic-clonic seizure was in 2016.  He recently switched to his primary care's office, who has followed up his Dilantin level, Dilantin dose was adjusted based on the level, up to 600 mg daily, while  he was he was taking 200/400 mg of Dilantin, he shows signs of toxicity, imbalance, dizziness, level was 26, later the dosage was adjusted to 200 mg 3 times  daily, lamotrigine 100 mg twice a day was stopped since May 16, 2018,  He is now on single antiepileptic medications Dilantin ER 100 mg 2 tablets 3 times a day.  CT head without contrast in December 2015 was normal.  UPDATE Sept 9 2019: EEG in July 2018 showed mild generalized slowing, indicating mild bihemisphere no function.  He is taking lamotrigine 150mg  bid mistakenly with lamotrigine 25 mg twice a day as well, dilantin dose was decreased to 143m 2 tabs qhs, he has no recurrent seizure, no longer has excessive daytime sleepiness, drowsiness,  He continues to suffer significant mood disorder, is receiving adjustment of his Seroquel dosage by his psychiatrist.  Mar 23, 2019 SS: Richard Ellison 23 year old male with history of seizures. Has tapered off Dilantin. Is currently taking lamotrigine extended release 300 mg, 1 tablet at bedtime.  He reports he is tolerating the Lamictal well, denies any trouble with his gait or balance  He has been doing well.  He had any recurrent seizures.  He reports he did have a panic attack one time when he went to the grocery store, he recognized it was coming, when outside in the car.  Unfortunately his primary care office, [facility] recently shut down without much notice.  They were prescribing him a prescription for Prozac, trazodone, Seroquel.  He started with a new psychiatrist at [facility].  They would not prescribe his medications, said primary care would have to do so.  He reports he only has a few days left.  Observations/Objective: Alert, answers questions appropriately, speech is clear and concise, follows commands, facial symmetry noted, gait is intact  Assessment and Plan: 1.  Seizures 2. Mood disorder  He is doing quite well.  He has not had any recurrent seizures.  I will check blood work including a Lamictal level.  He will come into the office in the next 1 to 2 weeks to have blood work done.  [facility].  He is nearly out of his daily medications.  I will provide a 30-day supply of primary medications including trazodone, Seroquel, Prozac at prescribed doses.  They are to follow-up with referral for primary care and additional refills in the future.   Follow Up Instructions:   I have a follow-up appointment in 6 months 09/26/2019 at 8:15 am   I discussed the assessment and treatment plan with the patient. The patient was provided an opportunity to ask questions and all were answered. The patient agreed with the plan and demonstrated an understanding of the instructions.   The patient was advised to call back or seek an in-person evaluation if the symptoms worsen or if the condition fails to improve as anticipated.  I provided 25 minutes of non-face-to-face time during this encounter.   Otila KluverSarah Etola Mull, AGNP-C, DNP  [facility] [address], [address] [place], [place] [address] [phone]

## 2019-03-27 NOTE — Progress Notes (Signed)
I have reviewed and agreed above plan. 

## 2019-05-09 ENCOUNTER — Other Ambulatory Visit: Payer: Self-pay | Admitting: Neurology

## 2019-05-09 NOTE — Telephone Encounter (Signed)
Pt has called for a refill on LamoTRIgine 300 MG TB24 24 hour tablet    QUEtiapine (SEROQUEL XR) 200 MG 24 hr tablet(Expired)   QUEtiapine (SEROQUEL) 100 MG tablet   traZODone (DESYREL) 50 MG tablet  [facility] [phone]

## 2019-05-10 MED ORDER — QUETIAPINE FUMARATE 100 MG PO TABS: 100.0000 mg | ORAL_TABLET | Freq: Two times a day (BID) | ORAL | 1 refills | Status: DC

## 2019-05-10 MED ORDER — FLUOXETINE HCL 20 MG PO CAPS: 20.0000 mg | ORAL_CAPSULE | Freq: Every day | ORAL | 1 refills | Status: DC

## 2019-05-10 MED ORDER — TRAZODONE HCL 50 MG PO TABS: ORAL_TABLET | ORAL | 1 refills | Status: DC

## 2019-05-10 NOTE — Addendum Note (Signed)
Addended by: Suzzanne Cloud on: 05/10/2019 04:44 PM   Modules accepted: Orders

## 2019-05-10 NOTE — Telephone Encounter (Signed)
I will renew his medications again. He will need to follow-up with PCP. I have the medications in with 1 refill.

## 2019-05-10 NOTE — Telephone Encounter (Signed)
I spoke to pt.  I relayed that refills available at [facility] in [place] for Lamotrigine.  The others seroquel, prozac, trazadone were to filled by pcp in future.  Saran NP filled as a Manufacturing engineer when he was here in May 2020.   He has not received call about referral or gotten an pcp on his own.  I reiterated this to him per SS/NP ofv note.  I would ask for another 30 day for seroquel?

## 2019-05-10 NOTE — Telephone Encounter (Signed)
I called pt and he will come in tomorrow for labs (he takes his medication at night).  I relayed will renew meds for another month but will need pcp for future refills.  I gave him # to [facility] on [address].  He will call.  Has has medicaid.

## 2019-05-10 NOTE — Telephone Encounter (Signed)
Please call the patient. He has yet to come to the office to get labs that I ordered in May. I will provide another refill on his maintenance medications, but he needs to get labs done. Also, where is he on getting in with a primary care doctor?

## 2019-06-16 ENCOUNTER — Ambulatory Visit: Payer: Medicaid Other | Attending: Internal Medicine | Admitting: Internal Medicine

## 2019-06-16 ENCOUNTER — Other Ambulatory Visit: Payer: Self-pay

## 2019-06-16 ENCOUNTER — Encounter: Payer: Self-pay | Admitting: Internal Medicine

## 2019-06-16 VITALS — BP 117/71 | HR 55 | Temp 98.3°F | Resp 16 | Ht 74.0 in | Wt 292.4 lb

## 2019-06-16 DIAGNOSIS — Z6837 Body mass index (BMI) 37.0-37.9, adult: Secondary | ICD-10-CM | POA: Diagnosis not present

## 2019-06-16 DIAGNOSIS — E669 Obesity, unspecified: Secondary | ICD-10-CM

## 2019-06-16 DIAGNOSIS — G40309 Generalized idiopathic epilepsy and epileptic syndromes, not intractable, without status epilepticus: Secondary | ICD-10-CM | POA: Diagnosis not present

## 2019-06-16 DIAGNOSIS — Z833 Family history of diabetes mellitus: Secondary | ICD-10-CM | POA: Diagnosis not present

## 2019-06-16 DIAGNOSIS — F17210 Nicotine dependence, cigarettes, uncomplicated: Secondary | ICD-10-CM | POA: Insufficient documentation

## 2019-06-16 DIAGNOSIS — F3132 Bipolar disorder, current episode depressed, moderate: Secondary | ICD-10-CM | POA: Diagnosis not present

## 2019-06-16 DIAGNOSIS — F17200 Nicotine dependence, unspecified, uncomplicated: Secondary | ICD-10-CM | POA: Diagnosis not present

## 2019-06-16 DIAGNOSIS — F319 Bipolar disorder, unspecified: Secondary | ICD-10-CM | POA: Insufficient documentation

## 2019-06-16 DIAGNOSIS — Z9989 Dependence on other enabling machines and devices: Secondary | ICD-10-CM

## 2019-06-16 DIAGNOSIS — Z915 Personal history of self-harm: Secondary | ICD-10-CM | POA: Insufficient documentation

## 2019-06-16 DIAGNOSIS — G4733 Obstructive sleep apnea (adult) (pediatric): Secondary | ICD-10-CM

## 2019-06-16 DIAGNOSIS — Z79899 Other long term (current) drug therapy: Secondary | ICD-10-CM | POA: Diagnosis not present

## 2019-06-16 DIAGNOSIS — F410 Panic disorder [episodic paroxysmal anxiety] without agoraphobia: Secondary | ICD-10-CM

## 2019-06-16 DIAGNOSIS — G40909 Epilepsy, unspecified, not intractable, without status epilepticus: Secondary | ICD-10-CM

## 2019-06-16 DIAGNOSIS — R7303 Prediabetes: Secondary | ICD-10-CM | POA: Diagnosis not present

## 2019-06-16 NOTE — Progress Notes (Signed)
Patient ID: Silver HugueninKhiry J Bigbee, male    DOB: April 28, 1996  MRN: [id number]  CC: New Patient (Initial Visit)   Subjective: Richard Ellison is a 23 y.o. male who presents for new pt visit. 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  Last seen 07/2018 but practice has since closed down  Bipolar depression/panic attack:  [facility] [place].  Last seen 2 mths ago.  Has appt coming up this mth.  Has psychiatrist there but tells me that the psychiatrist does not prescribe his medications.  Use to have group therapy there but has not had this since COVID.   Confuse about dose of Seroquel he is on.  He is also on Prozac and trazodone.  He tells me that all 3 of these medicines used to be prescribed by his PCP Attempted suicide several times in past including hanging, drowning, over dose.  States he has thoughts but has no intention of acting on them at this time.    SZ:  On Lamictal 300 mg at nights Last sz 2016 Does not drive [facility] to establish care there  OSA: compliant with using his CPAP machine  Hx of PreDM: He tells me that he was on metformin for about 6 months in the past but was able to be taken off of it as he was doing better.   -walk several times a wk -feels he does okay with eating habits  Tob dep:  Light smoker.  1 pk last about 1 wk.  Quit in past for 1 mth in past.  States he is in the process of quitting started 2 wks ago.    Past medical history, social history, surgical history and family history reviewed and updated  Patient Active Problem List   Diagnosis Date Noted  . Generalized idiopathic epilepsy and epileptic syndromes, not intractable, with status epilepticus (HCC) 05/31/2018     Current Outpatient Medications on File Prior to Visit  Medication Sig Dispense Refill  .  FLUoxetine (PROZAC) 20 MG capsule Take 1 capsule (20 mg total) by mouth daily. 30 capsule 1  . LamoTRIgine 300 MG TB24 24 hour tablet Take 1 tablet (300 mg total) by mouth at bedtime. Please discard his previous lamotrigine 150mg  bid prescription 90 tablet 4  . QUEtiapine (SEROQUEL) 100 MG tablet Take 1 tablet (100 mg total) by mouth 2 (two) times daily. 60 tablet 1  . traZODone (DESYREL) 50 MG tablet TAKE ONE TABLET BY MOUTH AT BEDTIME FOR insomnia 30 tablet 1   No current facility-administered medications on file prior to visit.     No Known Allergies  Social History   Socioeconomic History  . Marital status: Single    Spouse name: Not on file  . Number of children: 0  . Years of education: 1212  . Highest education level: High school graduate  Occupational History  . Occupation: Disabled  Social Needs  . Financial resource strain: Not on file  . Food insecurity    Worry: Not on file    Inability: Not on file  . Transportation needs    Medical: Not on file    Non-medical: Not on file  Tobacco Use  . Smoking status: Light Tobacco Smoker  . Smokeless tobacco: Never Used  Substance and Sexual Activity  . Alcohol use: No  .  Drug use: No  . Sexual activity: Not on file  Lifestyle  . Physical activity    Days per week: Not on file    Minutes per session: Not on file  . Stress: Not on file  Relationships  . Social Herbalist on phone: Not on file    Gets together: Not on file    Attends religious service: Not on file    Active member of club or organization: Not on file    Attends meetings of clubs or organizations: Not on file    Relationship status: Not on file  . Intimate partner violence    Fear of current or ex partner: Not on file    Emotionally abused: Not on file    Physically abused: Not on file    Forced sexual activity: Not on file  Other Topics Concern  . Not on file  Social History Narrative   Lives at home with his mother.   Left-handed.    Occasional caffeine use.    Family History  Problem Relation Age of Onset  . Healthy Mother   . Other Father        unsure of health status  . Diabetes Paternal Grandmother     Past Surgical History:  Procedure Laterality Date  . ADENOIDECTOMY    . INNER EAR SURGERY      ROS: Review of Systems  Constitutional: Negative for activity change and fever.  Respiratory: Negative for chest tightness and shortness of breath.   Cardiovascular: Negative for chest pain and palpitations.  Neurological: Positive for seizures. Negative for light-headedness and headaches.  Psychiatric/Behavioral: Positive for dysphoric mood.   Negative except as stated above  PHYSICAL EXAM: BP 117/71   Pulse (!) 55   Temp 98.3 F (36.8 C) (Oral)   Resp 16   Ht 6\' 2"  (1.88 m)   Wt 292 lb 6.4 oz (132.6 kg)   SpO2 97%   BMI 37.54 kg/m   Physical Exam  General appearance - alert, well appearing, young African-American male and in no distress Mental status -patient with odd affect but answers questions to the best of his ability.  There is some confusion with his medication.   Eyes - pupils equal and reactive, extraocular eye movements intact Nose - normal and patent, no erythema, discharge or polyps Mouth - mucous membranes moist, pharynx normal without lesions Neck - supple, no significant adenopathy Chest - clear to auscultation, no wheezes, rales or rhonchi, symmetric air entry Heart - normal rate, regular rhythm, normal S1, S2, no murmurs, rubs, clicks or gallops Extremities - peripheral pulses normal, no pedal edema, no clubbing or cyanosis  Depression screen PHQ 2/9 06/16/2019  Decreased Interest 2  Down, Depressed, Hopeless 2  PHQ - 2 Score 4  Altered sleeping 1  Tired, decreased energy 2  Change in appetite 1  Feeling bad or failure about yourself  3  Trouble concentrating 1  Moving slowly or fidgety/restless 1  Suicidal thoughts 3  PHQ-9 Score 16   GAD 7 : Generalized Anxiety Score  06/16/2019  Nervous, Anxious, on Edge 3  Control/stop worrying 2  Worry too much - different things 3  Trouble relaxing 2  Restless 2  Easily annoyed or irritable 2  Afraid - awful might happen 3  Total GAD 7 Score 17      CMP Latest Ref Rng & Units 10/30/2014 08/16/2012 09/07/2011  Glucose 70 - 99 mg/dL 117(H) 93 97  BUN 6 -  23 mg/dL 16 13 14   Creatinine 0.50 - 1.35 mg/dL 4.091.02 8.111.02 9.14(N1.20(H)  Sodium 135 - 145 mmol/L 138 138 141  Potassium 3.5 - 5.1 mmol/L 3.5 3.6 4.1  Chloride 96 - 112 mEq/L 104 103 103  CO2 19 - 32 mmol/L 27 25 -  Calcium 8.4 - 10.5 mg/dL 9.3 9.3 -  Total Protein 6.0 - 8.3 g/dL 7.6 - -  Total Bilirubin 0.3 - 1.2 mg/dL 0.4 - -  Alkaline Phos 39 - 117 U/L 78 - -  AST 0 - 37 U/L 23 - -  ALT 0 - 53 U/L 18 - -   Lipid Panel  No results found for: CHOL, TRIG, HDL, CHOLHDL, VLDL, LDLCALC, LDLDIRECT  CBC    Component Value Date/Time   WBC 8.6 10/30/2014 0707   RBC 5.17 10/30/2014 0707   HGB 13.9 10/30/2014 0707   HCT 41.9 10/30/2014 0707   PLT 227 10/30/2014 0707   MCV 81.0 10/30/2014 0707   MCH 26.9 10/30/2014 0707   MCHC 33.2 10/30/2014 0707   RDW 13.7 10/30/2014 0707   LYMPHSABS 1.6 10/30/2014 0707   MONOABS 0.6 10/30/2014 0707   EOSABS 0.1 10/30/2014 0707   BASOSABS 0.0 10/30/2014 0707    ASSESSMENT AND PLAN:  1. Bipolar affective disorder, depressed, moderate (HCC) 2. Panic disorder Patient gad 7 and PHQ 9 scores are very elevated. He admits that he sometimes has thoughts of suicide but has never acted on them since 2016 and his last suicide attempt.  He does not deem himself to be suicidal at this time. -Encourage patient to keep his appointment with [facility] later this month and to take his medications with him to discuss dosing. -I will have our LCSW follow-up with him also.  She is currently not in the clinic at this time  3. OSA on CPAP Encouraged him to continue compliance with CPAP  4. Tobacco dependence Patient advised to  quit smoking. Discussed health risks associated with smoking including lung and other types of cancers, chronic lung diseases and CV risks.. Pt ready to give trail of quitting.  Discussed methods to help quit including quitting cold Mal[place]urkey, use of NRT, Chantix and Bupropion.  Patient feels he is able to quit on his own.  I have encouraged him to set a quit date.  Less than 5-minute spent on counseling.  5. Seizure disorder (HCC) Controlled on Lamictal.  Followed by neurology - CBC - Comprehensive metabolic panel - Lamotrigine level  6. Prediabetes Discussed healthy eating habits.  Encouraged him to continue regular exercise - Hemoglobin A1c  7. Obesity (BMI 30-39.9) See #6 above   Patient was given the opportunity to ask questions.  Patient verbalized understanding of the plan and was able to repeat key elements of the plan.   Orders Placed This Encounter  Procedures  . CBC  . Comprehensive metabolic panel  . Lamotrigine level  . Hemoglobin A1c     Requested Prescriptions    No prescriptions requested or ordered in this encounter    Return in about 3 months (around 09/16/2019).  Jonah Blueeborah Bake, MD, FACP

## 2019-06-16 NOTE — Patient Instructions (Signed)
If ever you get thoughts of suicide and is tempted to act on them, I recommend that you be seen in the emergency room immediately.  Please keep your appointment with your psychiatrist later this month.  Follow a Healthy Eating Plan - You can do it! Limit sugary drinks.  Avoid sodas, sweet tea, sport or energy drinks, or fruit drinks.  Drink water, lo-fat milk, or diet drinks. Limit snack foods.   Cut back on candy, cake, cookies, chips, ice cream.  These are a special treat, only in small amounts. Eat plenty of vegetables.  Especially dark green, red, and orange vegetables. Aim for at least 3 servings a day. More is better! Include fruit in your daily diet.  Whole fruit is much healthier than fruit juice! Limit "white" bread, "white" pasta, "white" rice.   Choose "100% whole grain" products, brown or wild rice. Avoid fatty meats. Try "Meatless Monday" and choose eggs or beans one day a week.  When eating meat, choose lean meats like chicken, [place], and fish.  Grill, broil, or bake meats instead of frying, and eat poultry without the skin. Eat less salt.  Avoid frozen pizzas, frozen dinners and salty foods.  Use seasonings other than salt in cooking.  This can help blood pressure and keep you from swelling Beer, wine and liquor have calories.  If you can safely drink alcohol, limit to 1 drink per day for women, 2 drinks for men

## 2019-06-19 ENCOUNTER — Telehealth: Payer: Self-pay

## 2019-06-19 NOTE — Telephone Encounter (Signed)
Contacted pt to go over lab results pt didn't answer left a detailed vm informing pt of results and if she has any questions or concerns to give me a call  

## 2019-06-20 LAB — COMPREHENSIVE METABOLIC PANEL
ALT: 18 IU/L (ref 0–44)
AST: 25 IU/L (ref 0–40)
Albumin/Globulin Ratio: 1.4 (ref 1.2–2.2)
Albumin: 4.5 g/dL (ref 4.1–5.2)
Alkaline Phosphatase: 73 IU/L (ref 39–117)
BUN/Creatinine Ratio: 10 (ref 9–20)
BUN: 12 mg/dL (ref 6–20)
Bilirubin Total: 0.2 mg/dL (ref 0.0–1.2)
CO2: 24 mmol/L (ref 20–29)
Calcium: 9.2 mg/dL (ref 8.7–10.2)
Chloride: 103 mmol/L (ref 96–106)
Creatinine, Ser: 1.21 mg/dL (ref 0.76–1.27)
GFR calc Af Amer: 96 mL/min/{1.73_m2} (ref >59)
GFR calc non Af Amer: 83 mL/min/{1.73_m2} (ref >59)
Globulin, Total: 3.3 g/dL (ref 1.5–4.5)
Glucose: 92 mg/dL (ref 65–99)
Potassium: 4.4 mmol/L (ref 3.5–5.2)
Sodium: 142 mmol/L (ref 134–144)
Total Protein: 7.8 g/dL (ref 6.0–8.5)

## 2019-06-20 LAB — CBC
Hematocrit: 41.7 % (ref 37.5–51.0)
Hemoglobin: 13.4 g/dL (ref 13.0–17.7)
MCH: 25.8 pg — ABNORMAL LOW (ref 26.6–33.0)
MCHC: 32.1 g/dL (ref 31.5–35.7)
MCV: 80 fL (ref 79–97)
Platelets: 296 10*3/uL (ref 150–450)
RBC: 5.2 x10E6/uL (ref 4.14–5.80)
RDW: 14.2 % (ref 11.6–15.4)
WBC: 7.8 10*3/uL (ref 3.4–10.8)

## 2019-06-20 LAB — HEMOGLOBIN A1C
Est. average glucose Bld gHb Est-mCnc: 120 mg/dL
Hgb A1c MFr Bld: 5.8 % — ABNORMAL HIGH (ref 4.8–5.6)

## 2019-06-20 LAB — LAMOTRIGINE LEVEL: Lamotrigine Lvl: <1 ug/mL — ABNORMAL LOW (ref 2.0–20.0)

## 2019-06-21 ENCOUNTER — Telehealth: Payer: Self-pay | Admitting: Internal Medicine

## 2019-06-21 ENCOUNTER — Telehealth: Payer: Self-pay | Admitting: Neurology

## 2019-06-21 NOTE — Telephone Encounter (Signed)
Please call the patient.  I got a message from his primary care provider in regards to a recent blood test for Lamictal.  The level returned at < 1.0, likely indicating he is not taking the medication at all.  He has history of seizures. Can you find out what is going on?

## 2019-06-21 NOTE — Telephone Encounter (Signed)
Returned pt call and went over Dr. Minetti message pt doesn't have any questions or concerns  

## 2019-06-21 NOTE — Telephone Encounter (Signed)
-----   Message from Suzzanne Cloud, NP sent at 06/21/2019 12:49 PM EDT ----- It looks like he isn't taking it all. We will likely need to start back on the dose slowly titrating up. Please have him contact our office. I will have my nurse reach out to him now to discuss. Thank you for checking the level and for reaching out :)  ----- Message ----- From: Ladell Pier, MD Sent: 06/21/2019  11:49 AM EDT To: Suzzanne Cloud, NP  Saw this pt on Monday.  His Lamictal level was <1.  He told me on that visit that he was taking the medication.  I tried calling him today to verify that he is taking the medication but got a voicemail.  I left a detailed voicemail message asking him to get in contact with me or you for recommendations on how to adjust his medication.  In the event that he calls me back, please advise me on how the medication should be adjusted if indeed he is taking the 300 mg at night.

## 2019-06-21 NOTE — Telephone Encounter (Signed)
Phone call placed to patient this morning after receiving Lamictal level being less than 1.  I left her message on patient's voicemail for the cell phone informing him that the level is low and that I was calling to verify that he is taking the medication.  If he is taking it, I recommend that he get in contact with me or his neurologist for recommendation on how to adjust the medication.  I will send message to the neurologist also letting them know of his level. I also called the home phone number and a male answered stating that he is not available.  I told her to let him know that this is Dr. Wynetta Emery and that I have called and left a detailed message on his cell phone that he needs to listen to.

## 2019-06-21 NOTE — Telephone Encounter (Signed)
LMVM for pt that  received lab results from pcp, pertaining to lamotrigine level , low at 1.  Taking lamictal?  Please call back.

## 2019-06-21 NOTE — Telephone Encounter (Signed)
Patient called back returning PCP call. Please follow up.

## 2019-06-26 NOTE — Telephone Encounter (Signed)
I called the patient.  He has been back on lamotrigine XR 300 mg, since 06/16/2019.  He has not had any problem or noticed any rash.  He can continue on current dose.  He has been off the medication for about 3 weeks, while the medication was lost during a move.  I advised him to let us know in the future if he needs medication refills, not to let himself discontinue the medication.

## 2019-06-26 NOTE — Telephone Encounter (Signed)
I called pt.  He stopped medication when moving to another location (his mothers home).  He stated that he tried getting refills, but was not able too (thru hospital). He never called [place].  He stated was off for about 3-4wks.  No seizures he states.  When saw Dr. Wynetta Emery,  He restarted his medication. I relayed if issues with getting medication to call us and we wll try to get overide from insurance if we can so he is not without medication.  I confirmed next appt 09-26-19.  He was appreciative of call.

## 2019-07-07 ENCOUNTER — Telehealth: Payer: Self-pay | Admitting: *Deleted

## 2019-07-07 NOTE — Telephone Encounter (Signed)
LMOM

## 2019-07-07 NOTE — Telephone Encounter (Signed)
Patient called stating he is having swelling around his ankle for about 1 week. Per pt he would like to know what to do. Pt have an appt with provider 09-18-19.

## 2019-07-10 NOTE — Telephone Encounter (Signed)
Informed patient with what provider stated and he verbalized understanding and agreed with provider advise.

## 2019-07-13 ENCOUNTER — Telehealth: Payer: Self-pay | Admitting: Licensed Clinical Social Worker

## 2019-07-13 NOTE — Telephone Encounter (Signed)
Call placed to patient in attempt to follow up with [facility] consult from PCP. LCSW left message requesting a return call.  

## 2019-07-28 ENCOUNTER — Telehealth: Payer: Self-pay | Admitting: Licensed Clinical Social Worker

## 2019-07-28 NOTE — Telephone Encounter (Signed)
Call placed to patient. LCSW introduced self and explained role at [facility]. Pt was informed of referral from PCP to address behavioral health and/or resource needs. Pt agreed to schedule appointment for 07/31/2019. No additional concerns noted.

## 2019-07-31 ENCOUNTER — Institutional Professional Consult (permissible substitution): Payer: Medicaid Other | Admitting: Licensed Clinical Social Worker

## 2019-08-31 ENCOUNTER — Ambulatory Visit: Payer: Medicaid Other | Admitting: Internal Medicine

## 2019-09-18 ENCOUNTER — Ambulatory Visit (HOSPITAL_BASED_OUTPATIENT_CLINIC_OR_DEPARTMENT_OTHER): Payer: Medicaid Other | Admitting: Pharmacist

## 2019-09-18 ENCOUNTER — Encounter: Payer: Self-pay | Admitting: Internal Medicine

## 2019-09-18 ENCOUNTER — Ambulatory Visit: Payer: Medicaid Other | Attending: Internal Medicine | Admitting: Internal Medicine

## 2019-09-18 ENCOUNTER — Other Ambulatory Visit: Payer: Self-pay

## 2019-09-18 VITALS — BP 114/74 | HR 60 | Temp 99.2°F | Resp 16 | Wt 300.2 lb

## 2019-09-18 DIAGNOSIS — Z6838 Body mass index (BMI) 38.0-38.9, adult: Secondary | ICD-10-CM | POA: Diagnosis not present

## 2019-09-18 DIAGNOSIS — Z79899 Other long term (current) drug therapy: Secondary | ICD-10-CM | POA: Diagnosis not present

## 2019-09-18 DIAGNOSIS — G40909 Epilepsy, unspecified, not intractable, without status epilepticus: Secondary | ICD-10-CM | POA: Insufficient documentation

## 2019-09-18 DIAGNOSIS — Z9989 Dependence on other enabling machines and devices: Secondary | ICD-10-CM

## 2019-09-18 DIAGNOSIS — Z915 Personal history of self-harm: Secondary | ICD-10-CM | POA: Insufficient documentation

## 2019-09-18 DIAGNOSIS — F319 Bipolar disorder, unspecified: Secondary | ICD-10-CM | POA: Diagnosis not present

## 2019-09-18 DIAGNOSIS — Z23 Encounter for immunization: Secondary | ICD-10-CM

## 2019-09-18 DIAGNOSIS — R7303 Prediabetes: Secondary | ICD-10-CM | POA: Insufficient documentation

## 2019-09-18 DIAGNOSIS — G4733 Obstructive sleep apnea (adult) (pediatric): Secondary | ICD-10-CM | POA: Insufficient documentation

## 2019-09-18 DIAGNOSIS — E669 Obesity, unspecified: Secondary | ICD-10-CM | POA: Diagnosis not present

## 2019-09-18 DIAGNOSIS — R600 Localized edema: Secondary | ICD-10-CM | POA: Insufficient documentation

## 2019-09-18 DIAGNOSIS — Z87891 Personal history of nicotine dependence: Secondary | ICD-10-CM | POA: Insufficient documentation

## 2019-09-18 MED ORDER — NICOTINE POLACRILEX 2 MG MT GUM: 2.0000 mg | CHEWING_GUM | OROMUCOSAL | 1 refills | Status: DC | PRN

## 2019-09-18 NOTE — Addendum Note (Signed)
Addended by: Daisy Blossom, Annie Main L on: 09/18/2019 03:06 PM   Modules accepted: Orders

## 2019-09-18 NOTE — Progress Notes (Signed)
Patient ID: Richard HugueninKhiry J Ellison, male    DOB: 1996-05-07  MRN: [id number]  CC: Follow-up (3 month)   Subjective: Richard BrooksKhiry Ellison is a 23 y.o. male who presents for chronic ds management.  Last seen 06/2019 His concerns today include:  Grand mal and focal Sz disorder, OSA on CPAP, Bipolar depression, previous suicide attempts (hanging, over dose, drowning), Pre-DM, panic attacks, tob dep  Bipolar depression/Panic Attacks:  Saw psychiatry a [facility] since last visit. Seroquel and Prozac d/c.  Left on Trazodone and Lamotrigine -[facility]  tob dep:  On last visit, pt reported smoking 1 pk/wk,  He wanted to quit and felt he would be able to quit on his own -last smoked 1 mth ago.  Admits that he still gets strong craving for nicotine.  Finds himself eating more.  He has gained 8 lbs since last visit in 06/2019.  Snacking on potatoe chips, ice cream and drinking sodas  PreDM:  See hx above in regards to eating habits. -uses resistance bands for arm exercises and some foot paddles  OSA: reports compliance with CPAP  SZ;  No sz since last visit  Notice some swelling in lower legs from below knee down since last mth Admits that he eats a lot of salted foods Occasional SOB when "the nicotine withdrawal kicks in."    Patient Active Problem List   Diagnosis Date Noted  . Bipolar affective disorder, depressed, moderate (HCC) 06/16/2019  . Panic disorder 06/16/2019  . OSA on CPAP 06/16/2019  . Tobacco dependence 06/16/2019  . Prediabetes 06/16/2019  . Obesity (BMI 30-39.9) 06/16/2019  . Seizure disorder (HCC) 05/31/2018     Current Outpatient Medications on File Prior to Visit  Medication Sig Dispense Refill  . LamoTRIgine 300 MG TB24 24 hour tablet Take 1 tablet (300 mg total) by mouth at bedtime. Please discard his previous lamotrigine 150mg  bid prescription 90 tablet 4  . traZODone (DESYREL) 50 MG tablet TAKE ONE TABLET BY MOUTH AT BEDTIME FOR insomnia 30  tablet 1   No current facility-administered medications on file prior to visit.     No Known Allergies  Social History   Socioeconomic History  . Marital status: Single    Spouse name: Not on file  . Number of children: 0  . Years of education: 7512  . Highest education level: High school graduate  Occupational History  . Occupation: Disabled  Social Needs  . Financial resource strain: Not on file  . Food insecurity    Worry: Not on file    Inability: Not on file  . Transportation needs    Medical: Not on file    Non-medical: Not on file  Tobacco Use  . Smoking status: Light Tobacco Smoker  . Smokeless tobacco: Never Used  Substance and Sexual Activity  . Alcohol use: No  . Drug use: No  . Sexual activity: Not on file  Lifestyle  . Physical activity    Days per week: Not on file    Minutes per session: Not on file  . Stress: Not on file  Relationships  . Social Musicianconnections    Talks on phone: Not on file    Gets together: Not on file    Attends religious service: Not on file    Active member of club or organization: Not on file    Attends meetings of clubs or organizations: Not on file    Relationship status: Not on file  .  Intimate partner violence    Fear of current or ex partner: Not on file    Emotionally abused: Not on file    Physically abused: Not on file    Forced sexual activity: Not on file  Other Topics Concern  . Not on file  Social History Narrative   Lives at home with his mother.   Left-handed.   Occasional caffeine use.    Family History  Problem Relation Age of Onset  . Healthy Mother   . Other Father        unsure of health status  . Diabetes Paternal Grandmother     Past Surgical History:  Procedure Laterality Date  . ADENOIDECTOMY    . INNER EAR SURGERY      ROS: Review of Systems Negative except as stated above  PHYSICAL EXAM: BP 114/74   Pulse 60   Temp 99.2 F (37.3 C) (Oral)   Resp 16   Wt (!) 300 lb 3.2 oz (136.2  kg)   SpO2 99%   BMI 38.54 kg/m   Wt Readings from Last 3 Encounters:  09/18/19 (!) 300 lb 3.2 oz (136.2 kg)  06/16/19 292 lb 6.4 oz (132.6 kg)  07/18/18 262 lb 8 oz (119.1 kg)    Physical Exam  General appearance - alert, well appearing, obese young African-American male and in no distress Mental status - normal mood, behavior, speech, dress, motor activity, and thought processes Mouth - mucous membranes moist, pharynx normal without lesions Neck - supple, no significant adenopathy Chest - clear to auscultation, no wheezes, rales or rhonchi, symmetric air entry Heart - normal rate, regular rhythm, normal S1, S2, no murmurs, rubs, clicks or gallops.  No JVD Extremities -patient has trace bilateral lower extremity edema from mid shin down CMP Latest Ref Rng & Units 06/16/2019 10/30/2014 08/16/2012  Glucose 65 - 99 mg/dL 92 117(H) 93  BUN 6 - 20 mg/dL 12 16 13   Creatinine 0.76 - 1.27 mg/dL 1.21 1.02 1.02  Sodium 134 - 144 mmol/L 142 138 138  Potassium 3.5 - 5.2 mmol/L 4.4 3.5 3.6  Chloride 96 - 106 mmol/L 103 104 103  CO2 20 - 29 mmol/L 24 27 25   Calcium 8.7 - 10.2 mg/dL 9.2 9.3 9.3  Total Protein 6.0 - 8.5 g/dL 7.8 7.6 -  Total Bilirubin 0.0 - 1.2 mg/dL 0.2 0.4 -  Alkaline Phos 39 - 117 IU/L 73 78 -  AST 0 - 40 IU/L 25 23 -  ALT 0 - 44 IU/L 18 18 -   Lipid Panel  No results found for: CHOL, TRIG, HDL, CHOLHDL, VLDL, LDLCALC, LDLDIRECT  CBC    Component Value Date/Time   WBC 7.8 06/16/2019 1642   WBC 8.6 10/30/2014 0707   RBC 5.20 06/16/2019 1642   RBC 5.17 10/30/2014 0707   HGB 13.4 06/16/2019 1642   HCT 41.7 06/16/2019 1642   PLT 296 06/16/2019 1642   MCV 80 06/16/2019 1642   MCH 25.8 (L) 06/16/2019 1642   MCH 26.9 10/30/2014 0707   MCHC 32.1 06/16/2019 1642   MCHC 33.2 10/30/2014 0707   RDW 14.2 06/16/2019 1642   LYMPHSABS 1.6 10/30/2014 0707   MONOABS 0.6 10/30/2014 0707   EOSABS 0.1 10/30/2014 0707   BASOSABS 0.0 10/30/2014 0707    ASSESSMENT AND PLAN:  1.  Prediabetes 2. Obesity (BMI 30-39.9) -Discussed and encouraged healthy eating habits.  Advised patient to eliminate sugary drinks from his diet, cut back on white carbohydrates, eat more lean meat,  incorporate fresh fruits and vegetables into the diet and snack on healthy foods like fruits or nuts. -Encouraged and advised at least 150 minutes/week of moderate intensity exercise like brisk walking or riding a bike  3. Former smoker Commended him on quitting.  Encouraged him to remain tobacco free.  Discussed health risks associated with smoking.  He is agreeable to trying the nicotine gum to help in decreasing his cravings.  Less than 5 minutes spent on counseling  4. OSA on CPAP Commended him on compliance and encouraged him to continue to use it every night  5. Edema of both legs Encouraged him to cut back on salty foods and to use less table salt - Brain natriuretic peptide - TSH  6. Need for Tdap vaccination Given  Patient was given the opportunity to ask questions.  Patient verbalized understanding of the plan and was able to repeat key elements of the plan.   Orders Placed This Encounter  Procedures  . Brain natriuretic peptide  . TSH     Requested Prescriptions   Signed Prescriptions Disp Refills  . nicotine polacrilex (NICORETTE) 2 MG gum 100 tablet 1    Sig: Take 1 each (2 mg total) by mouth as needed for smoking cessation (no more than 30 pieces in a day).    Return in about 4 months (around 01/16/2020).  Jonah Blue, MD, FACP

## 2019-09-18 NOTE — Progress Notes (Addendum)
Patient presents for vaccination against influenza and tetanus per orders of Dr. Veley. Consent given. Counseling provided. No contraindications exists. Vaccine administered without incident.   

## 2019-09-18 NOTE — Patient Instructions (Addendum)
Try to cut back on salt in the foods as much as possible as salt causes you to retain fluid in the body.   I have sent a prescription to your pharmacy for the nicotine gum to use as needed to help decrease your cravings for the cigarettes.   Obesity, Adult Obesity is having too much body fat. Being obese means that your weight is more than what is healthy for you. BMI is a number that explains how much body fat you have. If you have a BMI of 30 or more, you are obese. Obesity is often caused by eating or drinking more calories than your body uses. Changing your lifestyle can help you lose weight. Obesity can cause serious health problems, such as:  Stroke.  Coronary artery disease (CAD).  Type 2 diabetes.  Some types of cancer, including cancers of the colon, breast, uterus, and gallbladder.  Osteoarthritis.  High blood pressure (hypertension).  High cholesterol.  Sleep apnea.  Gallbladder stones.  Infertility problems. What are the causes?  Eating meals each day that are high in calories, sugar, and fat.  Being born with genes that may make you more likely to become obese.  Having a medical condition that causes obesity.  Taking certain medicines.  Sitting a lot (having a sedentary lifestyle).  Not getting enough sleep.  Drinking a lot of drinks that have sugar in them. What increases the risk?  Having a family history of obesity.  Being an [place] woman.  Being a Hispanic man.  Living in an area with limited access to: ? [place], recreation centers, or sidewalks. ? Healthy food choices, such as grocery stores and farmers' markets. What are the signs or symptoms? The main sign is having too much body fat. How is this treated?  Treatment for this condition often includes changing your lifestyle. Treatment may include: ? Changing your diet. This may include making a healthy meal plan. ? Exercise. This may include activity that causes your heart to  beat faster (aerobic exercise) and strength training. Work with your doctor to design a program that works for you. ? Medicine to help you lose weight. This may be used if you are not able to lose 1 pound a week after 6 weeks of healthy eating and more exercise. ? Treating conditions that cause the obesity. ? Surgery. Options may include gastric banding and gastric bypass. This may be done if:  Other treatments have not helped to improve your condition.  You have a BMI of 40 or higher.  You have life-threatening health problems related to obesity. Follow these instructions at home: Eating and drinking   Follow advice from your doctor about what to eat and drink. Your doctor may tell you to: ? Limit fast food, sweets, and processed snack foods. ? Choose low-fat options. For example, choose low-fat milk instead of whole milk. ? Eat 5 or more servings of fruits or vegetables each day. ? Eat at home more often. This gives you more control over what you eat. ? Choose healthy foods when you eat out. ? Learn to read food labels. This will help you learn how much food is in 1 serving. ? Keep low-fat snacks available. ? Avoid drinks that have a lot of sugar in them. These include soda, fruit juice, iced tea with sugar, and flavored milk.  Drink enough water to keep your pee (urine) pale yellow.  Do not go on fad diets. Physical activity  Exercise often, as told by your  doctor. Most adults should get up to 150 minutes of moderate-intensity exercise every week.Ask your doctor: ? What types of exercise are safe for you. ? How often you should exercise.  Warm up and stretch before being active.  Do slow stretching after being active (cool down).  Rest between times of being active. Lifestyle  Work with your doctor and a food expert (dietitian) to set a weight-loss goal that is best for you.  Limit your screen time.  Find ways to reward yourself that do not involve food.  Do not drink  alcohol if: ? Your doctor tells you not to drink. ? You are pregnant, may be pregnant, or are planning to become pregnant.  If you drink alcohol: ? Limit how much you use to:  0-1 drink a day for women.  0-2 drinks a day for men. ? Be aware of how much alcohol is in your drink. In the [place] one drink equals one 12 oz bottle of beer (355 mL), one 5 oz glass of wine (148 mL), or one 1 oz glass of hard liquor (44 mL). General instructions  Keep a weight-loss journal. This can help you keep track of: ? The food that you eat. ? How much exercise you get.  Take over-the-counter and prescription medicines only as told by your doctor.  Take vitamins and supplements only as told by your doctor.  Think about joining a support group.  Keep all follow-up visits as told by your doctor. This is important. Contact a doctor if:  You cannot meet your weight loss goal after you have changed your diet and lifestyle for 6 weeks. Get help right away if you:  Are having trouble breathing.  Are having thoughts of harming yourself. Summary  Obesity is having too much body fat.  Being obese means that your weight is more than what is healthy for you.  Work with your doctor to set a weight-loss goal.  Get regular exercise as told by your doctor. This information is not intended to replace advice given to you by your health care provider. Make sure you discuss any questions you have with your health care provider. Document Released: 01/18/2012 Document Revised: 06/30/2018 Document Reviewed: 06/30/2018 Elsevier Patient Education  [place].   Influenza Virus Vaccine injection (Fluarix) What is this medicine? INFLUENZA VIRUS VACCINE (in floo EN zuh VAHY ruhs vak SEEN) helps to reduce the risk of getting influenza also known as the flu. This medicine may be used for other purposes; ask your health care provider or pharmacist if you have questions. COMMON BRAND NAME(S): Fluarix,  Fluzone What should I tell my health care provider before I take this medicine? They need to know if you have any of these conditions:  bleeding disorder like hemophilia  fever or infection  Guillain-Barre syndrome or other neurological problems  immune system problems  infection with the human immunodeficiency virus (HIV) or AIDS  low blood platelet counts  multiple sclerosis  an unusual or allergic reaction to influenza virus vaccine, eggs, chicken proteins, latex, gentamicin, other medicines, foods, dyes or preservatives  pregnant or trying to get pregnant  breast-feeding How should I use this medicine? This vaccine is for injection into a muscle. It is given by a health care professional. A copy of Vaccine Information Statements will be given before each vaccination. Read this sheet carefully each time. The sheet may change frequently. Talk to your pediatrician regarding the use of this medicine in children. Special care may be  needed. Overdosage: If you think you have taken too much of this medicine contact a poison control center or emergency room at once. NOTE: This medicine is only for you. Do not share this medicine with others. What if I miss a dose? This does not apply. What may interact with this medicine?  chemotherapy or radiation therapy  medicines that lower your immune system like etanercept, anakinra, infliximab, and adalimumab  medicines that treat or prevent blood clots like warfarin  phenytoin  steroid medicines like prednisone or cortisone  theophylline  vaccines This list may not describe all possible interactions. Give your health care provider a list of all the medicines, herbs, non-prescription drugs, or dietary supplements you use. Also tell them if you smoke, drink alcohol, or use illegal drugs. Some items may interact with your medicine. What should I watch for while using this medicine? Report any side effects that do not go away within 3  days to your doctor or health care professional. Call your health care provider if any unusual symptoms occur within 6 weeks of receiving this vaccine. You may still catch the flu, but the illness is not usually as bad. You cannot get the flu from the vaccine. The vaccine will not protect against colds or other illnesses that may cause fever. The vaccine is needed every year. What side effects may I notice from receiving this medicine? Side effects that you should report to your doctor or health care professional as soon as possible:  allergic reactions like skin rash, itching or hives, swelling of the face, lips, or tongue Side effects that usually do not require medical attention (report to your doctor or health care professional if they continue or are bothersome):  fever  headache  muscle aches and pains  pain, tenderness, redness, or swelling at site where injected  weak or tired This list may not describe all possible side effects. Call your doctor for medical advice about side effects. You may report side effects to FDA at 1-800-FDA-1088. Where should I keep my medicine? This vaccine is only given in a clinic, pharmacy, doctor's office, or other health care setting and will not be stored at home. NOTE: This sheet is a summary. It may not cover all possible information. If you have questions about this medicine, talk to your doctor, pharmacist, or health care provider.  2020 Elsevier/Gold Standard (2008-05-23 09:30:40)  Td Vaccine (Tetanus and Diphtheria): What You Need to Know 1. Why get vaccinated? Tetanus  and diphtheria are very serious diseases. They are rare in the [place] today, but people who do become infected often have severe complications. Td vaccine is used to protect adolescents and adults from both of these diseases. Both tetanus and diphtheria are infections caused by bacteria. Diphtheria spreads from person to person through coughing or sneezing.  Tetanus-causing bacteria enter the body through cuts, scratches, or wounds. TETANUS (Lockjaw) causes painful muscle tightening and stiffness, usually all over the body.  It can lead to tightening of muscles in the head and neck so you can't open your mouth, swallow, or sometimes even breathe. Tetanus kills about 1 out of every 10 people who are infected even after receiving the best medical care. DIPHTHERIA can cause a thick coating to form in the back of the throat.  It can lead to breathing problems, paralysis, heart failure, and death. Before vaccines, as many as 200,000 cases of diphtheria and hundreds of cases of tetanus were reported in the [place] each year. Since vaccination  began, reports of cases for both diseases have dropped by about 99%. 2. Td vaccine Td vaccine can protect adolescents and adults from tetanus and diphtheria. Td is usually given as a booster dose every 10 years but it can also be given earlier after a severe and dirty wound or burn. Another vaccine, called Tdap, which protects against pertussis in addition to tetanus and diphtheria, is sometimes recommended instead of Td vaccine. Your doctor or the person giving you the vaccine can give you more information. Td may safely be given at the same time as other vaccines. 3. Some people should not get this vaccine  A person who has ever had a life-threatening allergic reaction after a previous dose of any tetanus or diphtheria containing vaccine, OR has a severe allergy to any part of this vaccine, should not get Td vaccine. Tell the person giving the vaccine about any severe allergies.  Talk to your doctor if you: ? had severe pain or swelling after any vaccine containing diphtheria or tetanus, ? ever had a condition called Guillain Barr Syndrome (GBS), ? aren't feeling well on the day the shot is scheduled. 4. Risks of a vaccine reaction With any medicine, including vaccines, there is a chance of side effects.  These are usually mild and go away on their own. Serious reactions are also possible but are rare. Most people who get Td vaccine do not have any problems with it. Mild Problems following Td vaccine: (Did not interfere with activities)  Pain where the shot was given (about 8 people in 10)  Redness or swelling where the shot was given (about 1 person in 4)  Mild fever (rare)  Headache (about 1 person in 4)  Tiredness (about 1 person in 4) Moderate Problems following Td vaccine: (Interfered with activities, but did not require medical attention)  Fever over 102F (rare) Severe Problems following Td vaccine: (Unable to perform usual activities; required medical attention)  Swelling, severe pain, bleeding and/or redness in the arm where the shot was given (rare). Problems that could happen after any vaccine:  People sometimes faint after a medical procedure, including vaccination. Sitting or lying down for about 15 minutes can help prevent fainting, and injuries caused by a fall. Tell your doctor if you feel dizzy, or have vision changes or ringing in the ears.  Some people get severe pain in the shoulder and have difficulty moving the arm where a shot was given. This happens very rarely.  Any medication can cause a severe allergic reaction. Such reactions from a vaccine are very rare, estimated at fewer than 1 in a million doses, and would happen within a few minutes to a few hours after the vaccination. As with any medicine, there is a very remote chance of a vaccine causing a serious injury or death. The safety of vaccines is always being monitored. For more information, visit: http://floyd.org/ 5. What if there is a serious reaction? What should I look for?  Look for anything that concerns you, such as signs of a severe allergic reaction, very high fever, or unusual behavior. Signs of a severe allergic reaction can include hives, swelling of the face and throat, difficulty  breathing, a fast heartbeat, dizziness, and weakness. These would usually start a few minutes to a few hours after the vaccination. What should I do?  If you think it is a severe allergic reaction or other emergency that can't wait, call 9-1-1 or get the person to the nearest hospital. Otherwise, call your  doctor.  Afterward, the reaction should be reported to the Vaccine Adverse Event Reporting System (VAERS). Your doctor might file this report, or you can do it yourself through the VAERS web site at www.vaers.LAgents.no, or by calling 1-815-583-4591. VAERS does not give medical advice. 6. The National Vaccine Injury Compensation Program The [employer] Vaccine Injury Compensation Program (VICP) is a federal program that was created to compensate people who may have been injured by certain vaccines. Persons who believe they may have been injured by a vaccine can learn about the program and about filing a claim by calling 1-231-169-7315 or visiting the VICP website at SpiritualWord.at. There is a time limit to file a claim for compensation. 7. How can I learn more?  Ask your doctor. He or she can give you the vaccine package insert or suggest other sources of information.  Call your local or state health department.  Contact the Centers for Disease Control and Prevention (CDC): ? Call (681)339-7946 (1-800-CDC-INFO) ? Visit CDC's website at PicCapture.uy Vaccine Information Statement Td Vaccine (02/18/16) This information is not intended to replace advice given to you by your health care provider. Make sure you discuss any questions you have with your health care provider. Document Released: 08/23/2006 Document Revised: 06/13/2018 Document Reviewed: 06/13/2018 Elsevier Interactive Patient Education  [employer].

## 2019-09-19 LAB — BRAIN NATRIURETIC PEPTIDE: BNP: 3.7 pg/mL (ref 0.0–100.0)

## 2019-09-19 LAB — TSH: TSH: 0.53 u[IU]/mL (ref 0.450–4.500)

## 2019-09-25 ENCOUNTER — Telehealth: Payer: Self-pay | Admitting: Neurology

## 2019-09-25 NOTE — Telephone Encounter (Signed)
lvm to r/s 11/17 appt due to NP out

## 2019-09-26 ENCOUNTER — Ambulatory Visit: Payer: Medicaid Other | Admitting: Neurology

## 2019-11-29 NOTE — Progress Notes (Signed)
PATIENT: Richard Ellison DOB: [birth year]  REASON FOR VISIT: follow up HISTORY FROM: patient  HISTORY OF PRESENT ILLNESS: Today 11/30/19  HISTORY Richard Ellison, seen in request by his primary care nurse practitioner Quincy Carnes for evaluation of seizure, is accompanied by his mother [name] at today's visit, initial evaluation was on May 31, 2018.  I reviewed and summarized the referring note, he had a history of schizoaffective disorder, bipolar type, obstructive sleep apnea, epilepsy, prediabetes, on polypharmacy treatment, this including Dilantin 200 mg three times a day, Seroquel 100 mg once daily, 200 mg at night, fluoxetine 20 mg in the morning, lamotrigine 100 mg twice a day, Latuda 20 mg 2 tablets at bedtime, Metformin 500 mg 1 tablets daily,  He had a history of seizures since infant 2 months old, previouslywasunder the care of pediatric neurologist Dr. Sharene Skeans until 2014, he has generalized tonic-clonic seizure, also had seizures of staring, freezing up, then snapped out of it.  He been treated with stable dose of Dilantin 200 mg twice a day, he has been on Dilantin 400 mg daily since 2012, along with lamotrigine. He has seizure every few months, last generalized tonic-clonic seizure was in 2016.  He recently switched to his primary care's office, who has followed up his Dilantin level, Dilantin dose was adjusted based on the level, up to 600 mg daily, while he was he was taking 200/400 mg of Dilantin, he shows signs of toxicity, imbalance, dizziness, level was 26, later the dosage was adjusted to 200 mg 3 times daily, lamotrigine 100 mg twice a day was stopped since May 16, 2018,  He is now on single antiepileptic medications Dilantin ER 100 mg 2 tablets 3 times a day.  CT head without contrast in December 2015 was normal.  UPDATE Sept 9 2019: EEG in July 2018 showed mild generalized slowing, indicating mildbihemisphereno  function.  He is taking lamotrigine 150mg  bidmistakenly with lamotrigine 25 mg twice a day as well, dilantindose was decreased to148m 2 tabs qhs, hehas no recurrent seizure,no longer has excessive daytime sleepiness, drowsiness,  He continues to suffer significant mood disorder, is receiving adjustment of his Seroquel dosage by his psychiatrist.  Mar 23, 2019 SS: Richard Ellison 24 year old Ellison with history of seizures. Has tapered off Dilantin. Is currently taking lamotrigine extended release 300 mg, 1 tablet at bedtime.  He reports he is tolerating the Lamictal well, denies any trouble with his gait or balance  He has been doing well.  He had any recurrent seizures.  He reports he did have a panic attack one time when he went to the grocery store, he recognized it was coming, when outside in the car.  Unfortunately his primary care office, [facility] recently shut down without much notice.  They were prescribing him a prescription for Prozac, trazodone, Seroquel.  He started with a new psychiatrist at [facility].  They would not prescribe his medications, said primary care would have to do so.  He reports he only has a few days left.  Update November 30, 2019 SS: He reports he has been doing well, remains on Lamictal XR 300 mg tablet daily, reports compliance with medication.  He has not had recurrent seizure since 2016.  He is tolerating meds without side effect.  He is now established with a new primary care doctor.  He does not drive, he lives at home with his mom, is planning to go back to  school at [employer] for [employer].  Lamictal level 06/16/2019 was < 1.  REVIEW OF SYSTEMS: Out of a complete 14 system review of symptoms, the patient complains only of the following symptoms, and all other reviewed systems are negative.  Seizures  ALLERGIES: No Known Allergies  HOME MEDICATIONS: Outpatient Medications Prior to Visit  Medication Sig Dispense Refill  . nicotine  polacrilex (NICORETTE) 2 MG gum Take 1 each (2 mg total) by mouth as needed for smoking cessation (no more than 30 pieces in a day). 100 tablet 1  . traZODone (DESYREL) 50 MG tablet TAKE ONE TABLET BY MOUTH AT BEDTIME FOR insomnia 30 tablet 1  . LamoTRIgine 300 MG TB24 24 hour tablet Take 1 tablet (300 mg total) by mouth at bedtime. Please discard his previous lamotrigine 150mg  bid prescription 90 tablet 4   No facility-administered medications prior to visit.    PAST MEDICAL HISTORY: Past Medical History:  Diagnosis Date  . Aspergers' syndrome   . Autism   . Bipolar 1 disorder ([employer])   . OSA on CPAP   . Pre-diabetes   . Seizures ([place])     PAST SURGICAL HISTORY: Past Surgical History:  Procedure Laterality Date  . ADENOIDECTOMY    . INNER EAR SURGERY      FAMILY HISTORY: Family History  Problem Relation Age of Onset  . Healthy Mother   . Other Father        unsure of health status  . Diabetes Paternal Grandmother     SOCIAL HISTORY: Social History   Socioeconomic History  . Marital status: Single    Spouse name: Not on file  . Number of children: 0  . Years of education: 72  . Highest education level: High school graduate  Occupational History  . Occupation: Disabled  Tobacco Use  . Smoking status: Light Tobacco Smoker  . Smokeless tobacco: Never Used  Substance and Sexual Activity  . Alcohol use: No  . Drug use: No  . Sexual activity: Not on file  Other Topics Concern  . Not on file  Social History Narrative   Lives at home with his mother.   Left-handed.   Occasional caffeine use.   Social Determinants of Health   Financial Resource Strain:   . Difficulty of Paying Living Expenses: Not on file  Food Insecurity:   . Worried About Charity fundraiser in the Last Year: Not on file  . Ran Out of Food in the Last Year: Not on file  Transportation Needs:   . Lack of Transportation (Medical): Not on file  . Lack of Transportation (Non-Medical): Not on  file  Physical Activity:   . Days of Exercise per Week: Not on file  . Minutes of Exercise per Session: Not on file  Stress:   . Feeling of Stress : Not on file  Social Connections:   . Frequency of Communication with Friends and Family: Not on file  . Frequency of Social Gatherings with Friends and Family: Not on file  . Attends Religious Services: Not on file  . Active Member of Clubs or Organizations: Not on file  . Attends Archivist Meetings: Not on file  . Marital Status: Not on file  Intimate Partner Violence:   . Fear of Current or Ex-Partner: Not on file  . Emotionally Abused: Not on file  . Physically Abused: Not on file  . Sexually Abused: Not on file   PHYSICAL EXAM  Vitals:   11/30/19 1431  BP: (!) 151/79  Pulse: 69  Temp: (!) 97.1 F (36.2 C)  TempSrc: Oral  Weight: (!) 312 lb 3.2 oz (141.6 kg)  Height: 6\' 2"  (1.88 m)   Body mass index is 40.08 kg/m.  Generalized: Well developed, in no acute distress   Neurological examination  Mentation: Alert oriented to time, place, history taking. Follows all commands speech and language fluent Cranial nerve II-XII: Pupils were equal round reactive to light. Extraocular movements were full, visual field were full on confrontational test. Facial sensation and strength were normal.  Head turning and shoulder shrug  were normal and symmetric. Motor: The motor testing reveals 5 over 5 strength of all 4 extremities. Good symmetric motor tone is noted throughout.  Sensory: Sensory testing is intact to soft touch on all 4 extremities. No evidence of extinction is noted.  Coordination: Cerebellar testing reveals good finger-nose-finger and heel-to-shin bilaterally.  Gait and station: Gait is normal. Tandem gait is normal.  Reflexes: Deep tendon reflexes are symmetric and normal bilaterally.   DIAGNOSTIC DATA (LABS, IMAGING, TESTING) - I reviewed patient records, labs, notes, testing and imaging myself where  available.  Lab Results  Component Value Date   WBC 7.8 06/16/2019   HGB 13.4 06/16/2019   HCT 41.7 06/16/2019   MCV 80 06/16/2019   PLT 296 06/16/2019      Component Value Date/Time   NA 142 06/16/2019 1642   K 4.4 06/16/2019 1642   CL 103 06/16/2019 1642   CO2 24 06/16/2019 1642   GLUCOSE 92 06/16/2019 1642   GLUCOSE 117 (H) 10/30/2014 0707   BUN 12 06/16/2019 1642   CREATININE 1.21 06/16/2019 1642   CALCIUM 9.2 06/16/2019 1642   PROT 7.8 06/16/2019 1642   ALBUMIN 4.5 06/16/2019 1642   AST 25 06/16/2019 1642   ALT 18 06/16/2019 1642   ALKPHOS 73 06/16/2019 1642   BILITOT 0.2 06/16/2019 1642   GFRNONAA 83 06/16/2019 1642   GFRAA 96 06/16/2019 1642   No results found for: CHOL, HDL, LDLCALC, LDLDIRECT, TRIG, CHOLHDL Lab Results  Component Value Date   HGBA1C 5.8 (H) 06/16/2019   No results found for: VITAMINB12 Lab Results  Component Value Date   TSH 0.530 09/18/2019   ASSESSMENT AND PLAN 24 y.o. year old Ellison  has a past medical history of Aspergers' syndrome, Autism, Bipolar 1 disorder (HCC), OSA on CPAP, Pre-diabetes, and Seizures (HCC). here with:  1.  Seizures 2.  Mood disorder  He has continued to do well.  He has not had recurrent seizure.  He will remain on Lamictal XR 300 mg tablet.  I will check a Lamictal level today, in August, the level was 0.  He now has follow-up with a new primary care doctor, who is managing his daily medications.  It seems he has been able to come off the Seroquel and Prozac.  He will call for recurrent seizure, otherwise follow-up in 1 year or sooner if needed.   I spent 15 minutes with the patient. 50% of this time was spent this plan of care.   September, AGNP-C, DNP 11/30/2019, 3:11 PM [facility] [address] [place], [place] [phone]

## 2019-11-30 ENCOUNTER — Ambulatory Visit: Payer: Medicaid Other | Admitting: Neurology

## 2019-11-30 ENCOUNTER — Encounter: Payer: Self-pay | Admitting: Neurology

## 2019-11-30 ENCOUNTER — Other Ambulatory Visit: Payer: Self-pay

## 2019-11-30 VITALS — BP 151/79 | HR 69 | Temp 97.1°F | Ht 74.0 in | Wt 312.2 lb

## 2019-11-30 DIAGNOSIS — G40909 Epilepsy, unspecified, not intractable, without status epilepticus: Secondary | ICD-10-CM

## 2019-11-30 MED ORDER — LAMOTRIGINE ER 300 MG PO TB24: 300.0000 mg | ORAL_TABLET | Freq: Every day | ORAL | 4 refills | Status: DC

## 2019-11-30 NOTE — Patient Instructions (Signed)
Please continue taking Lamictal as prescribed, take everyday to prevent seizures. I will check your Lamictal level. Please call if you have a seizure. Other wise, see you in 1 year.

## 2019-12-01 LAB — LAMOTRIGINE LEVEL: Lamotrigine Lvl: 3.5 ug/mL (ref 2.0–20.0)

## 2019-12-04 ENCOUNTER — Telehealth: Payer: Self-pay

## 2019-12-04 NOTE — Telephone Encounter (Signed)
Called pt to go over recent lab results per Np Margie Ege. No Answer, Vm full. Will send a [employer].   "Please call the patient. Lamictal level is therapeutic, continue at current dosing."-NP SS

## 2019-12-04 NOTE — Telephone Encounter (Signed)
Please see previous note.

## 2020-01-10 NOTE — Progress Notes (Signed)
I have reviewed and agreed above plan. 

## 2020-01-18 ENCOUNTER — Ambulatory Visit: Payer: Medicaid Other | Attending: Internal Medicine | Admitting: Internal Medicine

## 2020-01-18 ENCOUNTER — Encounter: Payer: Self-pay | Admitting: Internal Medicine

## 2020-01-18 ENCOUNTER — Other Ambulatory Visit: Payer: Self-pay

## 2020-01-18 DIAGNOSIS — R7303 Prediabetes: Secondary | ICD-10-CM | POA: Diagnosis not present

## 2020-01-18 DIAGNOSIS — F3132 Bipolar disorder, current episode depressed, moderate: Secondary | ICD-10-CM | POA: Diagnosis not present

## 2020-01-18 DIAGNOSIS — Z9989 Dependence on other enabling machines and devices: Secondary | ICD-10-CM

## 2020-01-18 DIAGNOSIS — E669 Obesity, unspecified: Secondary | ICD-10-CM

## 2020-01-18 DIAGNOSIS — G4700 Insomnia, unspecified: Secondary | ICD-10-CM | POA: Diagnosis not present

## 2020-01-18 DIAGNOSIS — G4733 Obstructive sleep apnea (adult) (pediatric): Secondary | ICD-10-CM

## 2020-01-18 NOTE — Progress Notes (Signed)
Virtual Visit via Telephone Note Due to current restrictions/limitations of in-office visits due to the COVID-19 pandemic, this scheduled clinical appointment was converted to a telehealth visit  I connected with [name] on 01/18/20 at 1:49 p.m by telephone and verified that I am speaking with the correct person using two identifiers. I am in my office.  The patient is at home.  Only the patient and myself participated in this encounter.  I discussed the limitations, risks, security and privacy concerns of performing an evaluation and management service by telephone and the availability of in person appointments. I also discussed with the patient that there may be a patient responsible charge related to this service. The patient expressed understanding and agreed to proceed.   History of Present Illness: Grand mal and focal Sz disorder, OSA on CPAP, Bipolar depression, previous suicide attempts (hanging, over dose, drowning), Pre-DM, panic attacks, former smoker.  Patient last evaluated 09/2019.   Reports he has not been sleeping well.  Not sure why he is not sleeping well.  Reports compliance with CPAP. -he gets in bed around 10 p.m. He turns off TV and sounds.  Has problems getting comfortable. If he does not fall asleep by 11 p.m, he turns TV on until 12 a.m. Does not drink caff beverages or ETOH at nights.  Still on Trazodone.  Still sees psychiatrist and counseling through [facility]. No change in meds  PreDM/Obesity: reports he is drinking more water but also drink juices. Eating more bake meats.  Snacks on fruits and nuts Using elliptical 30 mins daily. Current wgh 301 lbs.  Wgh on last visit was 300 lbs.   He has remained free of cigarettes Outpatient Encounter Medications as of 01/18/2020  Medication Sig  . LamoTRIgine 300 MG TB24 24 hour tablet Take 1 tablet (300 mg total) by mouth at bedtime. Please discard his previous lamotrigine 150mg  bid prescription  . nicotine polacrilex  (NICORETTE) 2 MG gum Take 1 each (2 mg total) by mouth as needed for smoking cessation (no more than 30 pieces in a day).  . traZODone (DESYREL) 50 MG tablet TAKE ONE TABLET BY MOUTH AT BEDTIME FOR insomnia   No facility-administered encounter medications on file as of 01/18/2020.    Observations/Objective: Results for orders placed or performed in visit on 11/30/19  Lamotrigine level  Result Value Ref Range   Lamotrigine Lvl 3.5 2.0 - 20.0 ug/mL   Lab Results  Component Value Date   WBC 7.8 06/16/2019   HGB 13.4 06/16/2019   HCT 41.7 06/16/2019   MCV 80 06/16/2019   PLT 296 06/16/2019     Chemistry      Component Value Date/Time   NA 142 06/16/2019 1642   K 4.4 06/16/2019 1642   CL 103 06/16/2019 1642   CO2 24 06/16/2019 1642   BUN 12 06/16/2019 1642   CREATININE 1.21 06/16/2019 1642      Component Value Date/Time   CALCIUM 9.2 06/16/2019 1642   ALKPHOS 73 06/16/2019 1642   AST 25 06/16/2019 1642   ALT 18 06/16/2019 1642   BILITOT 0.2 06/16/2019 1642       Assessment and Plan: 1. Insomnia, unspecified type Good sleep hygiene discussed including getting in bed around about the same time every night, turning off all lights and sounds, if after 45 minutes of being in bed he is unable to fall asleep he should get up and do something until he feels sleepy and then try getting back in bed.  Told not to drink any caffeinated beverages or excessive alcohol use within several hours of bedtime.  He is on trazodone.  I have recommended purchasing melatonin 3 mg over-the-counter and taking it at bedtime with the trazodone  2. Prediabetes Discussed on encourage healthy eating habits.  He will continue regular exercise using his elliptical. Plan to check A1c on his next in person visit 3. Bipolar affective disorder, depressed, moderate (HCC) Followed by [facility]  4. OSA on CPAP Encourage him to continue using his CPAP every night  5. Obesity (BMI 30-39.9) See #2 above   Follow  Up Instructions: 4 mths   I discussed the assessment and treatment plan with the patient. The patient was provided an opportunity to ask questions and all were answered. The patient agreed with the plan and demonstrated an understanding of the instructions.   The patient was advised to call back or seek an in-person evaluation if the symptoms worsen or if the condition fails to improve as anticipated.  I provided 16  minutes of non-face-to-face time during this encounter.   Karle Plumber, MD

## 2020-04-22 ENCOUNTER — Telehealth: Payer: Self-pay | Admitting: Neurology

## 2020-04-22 NOTE — Telephone Encounter (Signed)
I called and was not able to LM.  (full).

## 2020-04-22 NOTE — Telephone Encounter (Addendum)
Pt's mother is asking for a call to discuss pt's LamoTRIgine 300 MG TB24 24 hour tablet .  Mother states as a result of the medication pt's balance is off.  Please call

## 2020-04-23 NOTE — Telephone Encounter (Signed)
Called and mailbox full.  

## 2020-04-23 NOTE — Telephone Encounter (Signed)
Pt returned call. Please call back when available. [phone]

## 2020-04-23 NOTE — Telephone Encounter (Signed)
Called [phone] (M) as well as the other # left and was able to [facility] on this, but not on then # they left to call back on.

## 2020-04-24 NOTE — Telephone Encounter (Signed)
I called pt he is having dizziness/ligh headedness, fatigue.  He has been on the lamictal 300mg  XR daily since prior 11/2019.  I relayed that being dizzy can cause offbalanceness, and can be caused by a number of different issues, dehydration, bp, blood sugar, throid, etc..  Recommended to call pcp for initial evaluation and I will let SS/NP know but she is out till Monday.  He verbalized understanding.

## 2020-04-29 NOTE — Telephone Encounter (Signed)
Agree with plan, he should take lamotrigine XR 300 mg daily as prescribed. I am not sure why he took 2 tablets daily.

## 2020-04-29 NOTE — Telephone Encounter (Signed)
Noted  

## 2020-04-29 NOTE — Telephone Encounter (Signed)
I called mother of pt.  She relayed that the pt was taking lamotrigine ER 300mg  po bid, for 4 days last week, Sun thru wed.  She noted the offbalance, wobbly, and told him to stop the lamotrigine for 3 days Thu Fri sat. and he is better now and back to taking lamotrigine ER 300mg  daily.  She will contact [facility] for other psych meds as needed.  No seizures.  I will call back if anything other needed as lab work she verbalized understanding.

## 2020-05-15 ENCOUNTER — Telehealth: Payer: Self-pay | Admitting: Neurology

## 2020-05-15 NOTE — Telephone Encounter (Signed)
Pt's mother D. Verl Bangs called needing to speak to RN about getting information to help with the pt's disability forms. Please advise.

## 2020-05-15 NOTE — Telephone Encounter (Signed)
Called and VM full, could not LM.

## 2020-05-16 NOTE — Telephone Encounter (Signed)
Spoke to mother of pt.  Pt has SSD since 4-26 yrs old.  He is trying to get assistance with housing. Needs records from his providers.  I noted that will send to [facility], [facility]. Mother [phone]. He will need to sign release and possibly fee for records.

## 2020-05-21 ENCOUNTER — Telehealth: Payer: Self-pay | Admitting: Internal Medicine

## 2020-05-21 NOTE — Telephone Encounter (Signed)
Copied from CRM [id number]. Topic: General - Other >> May 15, 2020 12:14 PM Lyn Hollingshead D wrote: Reason for CRM: PT mother needs to speak with a nurse, about some paperwork she needs / please advise

## 2020-05-23 ENCOUNTER — Ambulatory Visit: Payer: Medicaid Other | Admitting: Internal Medicine

## 2020-07-09 ENCOUNTER — Ambulatory Visit: Payer: Medicaid Other | Attending: Internal Medicine | Admitting: Internal Medicine

## 2020-07-09 ENCOUNTER — Other Ambulatory Visit: Payer: Self-pay

## 2020-07-09 ENCOUNTER — Encounter: Payer: Self-pay | Admitting: Internal Medicine

## 2020-07-09 VITALS — BP 130/82 | HR 66 | Temp 98.6°F | Resp 16 | Wt 296.0 lb

## 2020-07-09 DIAGNOSIS — Z716 Tobacco abuse counseling: Secondary | ICD-10-CM | POA: Diagnosis not present

## 2020-07-09 DIAGNOSIS — R45851 Suicidal ideations: Secondary | ICD-10-CM | POA: Insufficient documentation

## 2020-07-09 DIAGNOSIS — Z6838 Body mass index (BMI) 38.0-38.9, adult: Secondary | ICD-10-CM | POA: Insufficient documentation

## 2020-07-09 DIAGNOSIS — Z915 Personal history of self-harm: Secondary | ICD-10-CM | POA: Diagnosis not present

## 2020-07-09 DIAGNOSIS — G40909 Epilepsy, unspecified, not intractable, without status epilepticus: Secondary | ICD-10-CM | POA: Diagnosis not present

## 2020-07-09 DIAGNOSIS — R7303 Prediabetes: Secondary | ICD-10-CM | POA: Insufficient documentation

## 2020-07-09 DIAGNOSIS — F17210 Nicotine dependence, cigarettes, uncomplicated: Secondary | ICD-10-CM | POA: Diagnosis not present

## 2020-07-09 DIAGNOSIS — Z79899 Other long term (current) drug therapy: Secondary | ICD-10-CM | POA: Insufficient documentation

## 2020-07-09 DIAGNOSIS — G4733 Obstructive sleep apnea (adult) (pediatric): Secondary | ICD-10-CM | POA: Insufficient documentation

## 2020-07-09 DIAGNOSIS — G40109 Localization-related (focal) (partial) symptomatic epilepsy and epileptic syndromes with simple partial seizures, not intractable, without status epilepticus: Secondary | ICD-10-CM | POA: Diagnosis not present

## 2020-07-09 DIAGNOSIS — E669 Obesity, unspecified: Secondary | ICD-10-CM | POA: Diagnosis not present

## 2020-07-09 DIAGNOSIS — G4700 Insomnia, unspecified: Secondary | ICD-10-CM | POA: Insufficient documentation

## 2020-07-09 DIAGNOSIS — Z2821 Immunization not carried out because of patient refusal: Secondary | ICD-10-CM

## 2020-07-09 DIAGNOSIS — F17200 Nicotine dependence, unspecified, uncomplicated: Secondary | ICD-10-CM | POA: Diagnosis not present

## 2020-07-09 DIAGNOSIS — F314 Bipolar disorder, current episode depressed, severe, without psychotic features: Secondary | ICD-10-CM | POA: Diagnosis not present

## 2020-07-09 DIAGNOSIS — R030 Elevated blood-pressure reading, without diagnosis of hypertension: Secondary | ICD-10-CM | POA: Insufficient documentation

## 2020-07-09 NOTE — Progress Notes (Signed)
Patient ID: Richard Ellison, male    DOB: Jul 08, 1996  MRN: [id number]  CC: Follow-up (4 month )   Subjective: Richard Ellison is a 24 y.o. 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   Dealing with some depression Still followed by [facility] by someone he calls Dr. Leonard Ellison.  Last virtual visit was 3 wks ago Endorses SI/HI all the time but states he will never act on his thoughts.  He gets frustrated with his mother and sister who he fells always talk down to him. "I feel like I am at the end of the cliff but rational enough not to cross that line."  Only on Trazodone. PHQ score today is 24.  SZ:  Taking Lamictal.  No sz since last visit.  PreDM:  Not much exercise activity.  Feels he does okay with eating habits.  Wgh down 16 lbs since 11/2019  Tobacco: He admits that he has started smoking again.  1 pack of cigarettes lasts about 1 week.  He knows that cigarette smoking is not good for his health.  He states that he plans to quit again.  HM: He declines receiving the flu vaccine. Patient Active Problem List   Diagnosis Date Noted  . Insomnia 01/18/2020  . Former smoker 09/18/2019  . Bipolar affective disorder, depressed, moderate (HCC) 06/16/2019  . Panic disorder 06/16/2019  . OSA on CPAP 06/16/2019  . Prediabetes 06/16/2019  . Obesity (BMI 30-39.9) 06/16/2019  . Seizure disorder (HCC) 05/31/2018     Current Outpatient Medications on File Prior to Visit  Medication Sig Dispense Refill  . LamoTRIgine 300 MG TB24 24 hour tablet Take 1 tablet (300 mg total) by mouth at bedtime. Please discard his previous lamotrigine 150mg  bid prescription 90 tablet 4  . nicotine polacrilex (NICORETTE) 2 MG gum Take 1 each (2 mg total) by mouth as needed for smoking cessation (no more than 30 pieces in a day). 100  tablet 1  . traZODone (DESYREL) 50 MG tablet TAKE ONE TABLET BY MOUTH AT BEDTIME FOR insomnia 30 tablet 1   No current facility-administered medications on file prior to visit.    No Known Allergies  Social History   Socioeconomic History  . Marital status: Single    Spouse name: Not on file  . Number of children: 0  . Years of education: 46  . Highest education level: High school graduate  Occupational History  . Occupation: Disabled  Tobacco Use  . Smoking status: Light Tobacco Smoker  . Smokeless tobacco: Never Used  Vaping Use  . Vaping Use: Never used  Substance and Sexual Activity  . Alcohol use: No  . Drug use: No  . Sexual activity: Not on file  Other Topics Concern  . Not on file  Social History Narrative   Lives at home with his mother.   Left-handed.   Occasional caffeine use.   Social Determinants of Health   Financial Resource Strain:   . Difficulty of Paying Living Expenses: Not on file  Food Insecurity:   . Worried About 14 in the Last Year: Not on file  . Ran Out of Food in the Last Year: Not on file  Transportation Needs:   . Lack of Transportation (Medical): Not on file  . Lack of Transportation (Non-Medical): Not on file  Physical Activity:   .  Days of Exercise per Week: Not on file  . Minutes of Exercise per Session: Not on file  Stress:   . Feeling of Stress : Not on file  Social Connections:   . Frequency of Communication with Friends and Family: Not on file  . Frequency of Social Gatherings with Friends and Family: Not on file  . Attends Religious Services: Not on file  . Active Member of Clubs or Organizations: Not on file  . Attends Banker Meetings: Not on file  . Marital Status: Not on file  Intimate Partner Violence:   . Fear of Current or Ex-Partner: Not on file  . Emotionally Abused: Not on file  . Physically Abused: Not on file  . Sexually Abused: Not on file    Family History  Problem  Relation Age of Onset  . Healthy Mother   . Other Father        unsure of health status  . Diabetes Paternal Grandmother     Past Surgical History:  Procedure Laterality Date  . ADENOIDECTOMY    . INNER EAR SURGERY      ROS: Review of Systems Negative except as stated above  PHYSICAL EXAM: BP 133/83   Pulse 66   Temp 98.6 F (37 C)   Resp 16   Wt 296 lb (134.3 kg)   SpO2 98%   BMI 38.00 kg/m   Wt Readings from Last 3 Encounters:  07/09/20 296 lb (134.3 kg)  11/30/19 (!) 312 lb 3.2 oz (141.6 kg)  09/18/19 (!) 300 lb 3.2 oz (136.2 kg)    Physical Exam  General appearance - alert, well appearing, young AAM and in no distress Mental status -patient is very talkative.  He goes often multiple tangents. Neck - supple, no significant adenopathy Chest - clear to auscultation, no wheezes, rales or rhonchi, symmetric air entry Heart - normal rate, regular rhythm, normal S1, S2, no murmurs, rubs, clicks or gallops Extremities - peripheral pulses normal, no pedal edema, no clubbing or cyanosis  Depression screen [facility] 2/9 07/09/2020 06/16/2019  Decreased Interest 3 2  Down, Depressed, Hopeless 3 2  PHQ - 2 Score 6 4  Altered sleeping 1 1  Tired, decreased energy 3 2  Change in appetite 2 1  Feeling bad or failure about yourself  3 3  Trouble concentrating 3 1  Moving slowly or fidgety/restless 3 1  Suicidal thoughts 3 3  PHQ-9 Score 24 16    CMP Latest Ref Rng & Units 06/16/2019 10/30/2014 08/16/2012  Glucose 65 - 99 mg/dL 92 778(E) 93  BUN 6 - 20 mg/dL 12 16 13   Creatinine 0.76 - 1.27 mg/dL 4.23 5.36  Sodium 134 - 144 mmol/L 142 138 138  Potassium 3.5 - 5.2 mmol/L 4.4 3.5 3.6  Chloride 96 - 106 mmol/L 103 104 103  CO2 20 - 29 mmol/L 24 27 25   Calcium 8.7 - 10.2 mg/dL 9.2 9.3 9.3  Total Protein 6.0 - 8.5 g/dL 7.8 7.6 -  Total Bilirubin 0.0 - 1.2 mg/dL 0.2 0.4 -  Alkaline Phos 39 - 117 IU/L 73 78 -  AST 0 - 40 IU/L 25 23 -  ALT 0 - 44 IU/L 18 18 -   Lipid Panel    No results found for: CHOL, TRIG, HDL, CHOLHDL, VLDL, LDLCALC, LDLDIRECT  CBC    Component Value Date/Time   WBC 7.8 06/16/2019 1642   WBC 8.6 10/30/2014 0707   RBC 5.20 06/16/2019 1642   RBC  5.17 10/30/2014 0707   HGB 13.4 06/16/2019 1642   HCT 41.7 06/16/2019 1642   PLT 296 06/16/2019 1642   MCV 80 06/16/2019 1642   MCH 25.8 (L) 06/16/2019 1642   MCH 26.9 10/30/2014 0707   MCHC 32.1 06/16/2019 1642   MCHC 33.2 10/30/2014 0707   RDW 14.2 06/16/2019 1642   LYMPHSABS 1.6 10/30/2014 0707   MONOABS 0.6 10/30/2014 0707   EOSABS 0.1 10/30/2014 0707   BASOSABS 0.0 10/30/2014 0707    ASSESSMENT AND PLAN: 1. Bipolar disorder, current episode depressed, severe, without psychotic features (HCC) I asked patient whether he is on any other psychiatric medications besides trazodone through his mental health provider.  He tells me know.  I request for him to sign a release to give me permission to speak with his provider at [facility].  He initially agreed but then changed his mind.  Encouraged him to be seen in the emergency room if he is having suicidal and homicidal thoughts.  Patient declined but was agreeable to receiving information about 24-hour services provided in the area including the crisis plan.  I gave him this information including the phone numbers and advised to call if he feels that he is going to hurt himself or anyone else.  I will also have our LCSW follow-up with him.  2. Seizure disorder (HCC) Controlled on current medication.  3. Tobacco dependence Advised to quit.  Discussed health risks associated with smoking.  He declines medications to help him quit.  He states that he will try to quit on his own.  Encouraged him to set a quit date.  Less than 5 minutes spent on counseling.  4. Elevated blood-pressure reading without diagnosis of hypertension DASH diet discussed and encouraged.  We will plan to recheck blood pressure on follow-up visit.  5. Prediabetes Discussed and  encourage healthy eating habits.  Encouraged him to get in some form of moderate intensity exercise at least 4 days a week for 30 to 45 minutes. - CBC - Comprehensive metabolic panel - Lipid panel - Hemoglobin A1c  6. Influenza vaccination declined This was offered.  Patient declined.    Patient was given the opportunity to ask questions.  Patient verbalized understanding of the plan and was able to repeat key elements of the plan.   No orders of the defined types were placed in this encounter.    Requested Prescriptions    No prescriptions requested or ordered in this encounter    No follow-ups on file.  Jonah Blue, MD, FACP

## 2020-07-09 NOTE — Patient Instructions (Signed)
Please be seen in the emergency

## 2020-07-10 DIAGNOSIS — Z2821 Immunization not carried out because of patient refusal: Secondary | ICD-10-CM | POA: Insufficient documentation

## 2020-07-10 DIAGNOSIS — R030 Elevated blood-pressure reading, without diagnosis of hypertension: Secondary | ICD-10-CM | POA: Insufficient documentation

## 2020-07-10 LAB — COMPREHENSIVE METABOLIC PANEL
ALT: 13 IU/L (ref 0–44)
AST: 18 IU/L (ref 0–40)
Albumin/Globulin Ratio: 1.5 (ref 1.2–2.2)
Albumin: 4.8 g/dL (ref 4.1–5.2)
Alkaline Phosphatase: 82 IU/L (ref 48–121)
BUN/Creatinine Ratio: 8 — ABNORMAL LOW (ref 9–20)
BUN: 10 mg/dL (ref 6–20)
Bilirubin Total: 0.5 mg/dL (ref 0.0–1.2)
CO2: 26 mmol/L (ref 20–29)
Calcium: 9.6 mg/dL (ref 8.7–10.2)
Chloride: 101 mmol/L (ref 96–106)
Creatinine, Ser: 1.27 mg/dL (ref 0.76–1.27)
GFR calc Af Amer: 90 mL/min/{1.73_m2} (ref >59)
GFR calc non Af Amer: 77 mL/min/{1.73_m2} (ref >59)
Globulin, Total: 3.1 g/dL (ref 1.5–4.5)
Glucose: 100 mg/dL — ABNORMAL HIGH (ref 65–99)
Potassium: 4.4 mmol/L (ref 3.5–5.2)
Sodium: 137 mmol/L (ref 134–144)
Total Protein: 7.9 g/dL (ref 6.0–8.5)

## 2020-07-10 LAB — CBC
Hematocrit: 44.8 % (ref 37.5–51.0)
Hemoglobin: 14.2 g/dL (ref 13.0–17.7)
MCH: 25.8 pg — ABNORMAL LOW (ref 26.6–33.0)
MCHC: 31.7 g/dL (ref 31.5–35.7)
MCV: 81 fL (ref 79–97)
Platelets: 271 10*3/uL (ref 150–450)
RBC: 5.51 x10E6/uL (ref 4.14–5.80)
RDW: 13.6 % (ref 11.6–15.4)
WBC: 8.2 10*3/uL (ref 3.4–10.8)

## 2020-07-10 LAB — LIPID PANEL
Chol/HDL Ratio: 3 ratio (ref 0.0–5.0)
Cholesterol, Total: 107 mg/dL (ref 100–199)
HDL: 36 mg/dL — ABNORMAL LOW (ref >39)
LDL Chol Calc (NIH): 47 mg/dL (ref 0–99)
Triglycerides: 138 mg/dL (ref 0–149)
VLDL Cholesterol Cal: 24 mg/dL (ref 5–40)

## 2020-07-10 LAB — HEMOGLOBIN A1C
Est. average glucose Bld gHb Est-mCnc: 131 mg/dL
Hgb A1c MFr Bld: 6.2 % — ABNORMAL HIGH (ref 4.8–5.6)

## 2020-07-11 ENCOUNTER — Telehealth: Payer: Self-pay

## 2020-07-11 NOTE — Telephone Encounter (Signed)
Contacted pt to go over lab results spoke with pt mother and went over lab results. Pt mother doesn't have any questions or concerns

## 2020-07-22 ENCOUNTER — Ambulatory Visit: Payer: Medicaid Other | Attending: Internal Medicine | Admitting: Licensed Clinical Social Worker

## 2020-07-22 DIAGNOSIS — F3132 Bipolar disorder, current episode depressed, moderate: Secondary | ICD-10-CM

## 2020-07-22 DIAGNOSIS — F314 Bipolar disorder, current episode depressed, severe, without psychotic features: Secondary | ICD-10-CM

## 2020-08-16 NOTE — BH Specialist Note (Signed)
Integrated Behavioral Health Visit via Telemedicine (Telephone)  07/22/2020 Richard Ellison [id number]  Number of Integrated Behavioral Health visits: 1 Session Start time: 1:35 PM  Session End time: 2:00 PM Total time: 25 minutes  Referring Provider: Dr. Laural Benes Type of Service: Individual Patient or Family location: Home [facility] Provider location: Office All persons participating in visit: LCSW and Patient   I connected with [name] by telephone and verified that I am speaking with the correct person using two identifiers.   Discussed confidentiality: Yes   Confirmed demographics & insurance:  Yes   I discussed that engaging in this virtual visit, they consent to the provision of behavioral healthcare and the services will be billed under their insurance.   Patient and/or legal guardian expressed understanding and consented to virtual visit: Yes   PRESENTING CONCERNS: Patient or family reports the following symptoms/concerns: Pt reports increase in stress triggered by family and "people in general" Pt feels "stagnant" and would like to obtain independent living, in addition, to working Duration of problem: Ongoing; Pt reports diagnosis of Bipolar Depression and Anxiety for "years"; Severity of problem: moderate  STRENGTHS (Protective Factors/Coping Skills): Social connections and Concrete supports in place (healthy food, safe environments, etc.)  ASSESSMENT: Patient currently experiencing difficulty managing depression and anxiety symptoms triggered by stress and feelings of being stagnant. Pt reports hx of SI; however, denies current SI/HI or AVH.   Pt will benefit from continued medication management and therapy virtually through [facility] with Dr. B every 2-4 weeks. LCSW discussed benefits of signing ROI to assist with transparency and high level of care between PCP and MH providers; however, pt is not interested at this time. He receives strong support from family and is aware  of various crisis intervention resources.    GOALS ADDRESSED: Patient will: 1.  Reduce symptoms of: stress Pt agreed to continue walking, taking prescribed medications, and participating in therapy  2.  Increase knowledge and/or ability of: Resources LCSW provided pt with information on Vocational Rehab to assist with obtaining employment to save for independent housing  Progress of Goals: Ongoing  INTERVENTIONS: Interventions utilized:  Solution-Focused Strategies Standardized Assessments completed & reviewed: Not Needed   OUTCOME: Patient Response: Pt was engaged during session and was appreciative for the resources provided   PLAN: 1. Follow up with behavioral health clinician on : Contact LCSW with any additional behavioral health and/or resource needs 2. Behavioral recommendations: Continue participating in services with [facility] and utilize resources provided 3. Referral(s): Integrated Art gallery manager (In Clinic) and [employer] Resources:  Finances  I discussed the assessment and treatment plan with the patient and/or parent/guardian. They were provided an opportunity to ask questions and all were answered. They agreed with the plan and demonstrated an understanding of the instructions.   They were advised to call back or seek an in-person evaluation as appropriate.  I discussed that the purpose of this visit is to provide behavioral health care while limiting exposure to the novel coronavirus.  Discussed there is a possibility of technology failure and discussed alternative modes of communication if that failure occurs.  [name], LCSW 08/16/2020 12:40 PM

## 2020-12-03 ENCOUNTER — Ambulatory Visit: Payer: Medicaid Other | Admitting: Neurology

## 2020-12-03 ENCOUNTER — Encounter: Payer: Self-pay | Admitting: Neurology

## 2020-12-03 NOTE — Progress Notes (Deleted)
PATIENT: Richard Ellison DOB: Mar 11, 1996  REASON FOR VISIT: follow up HISTORY FROM: patient  HISTORY OF PRESENT ILLNESS: Today 12/03/20  HISTORY Richard Stemen Johnsonis a 25 years old male, seen in request by his primary care nurse practitioner Quincy Carnes for evaluation of seizure, is accompanied by his mother [name] at today's visit, initial evaluation was on May 31, 2018.  I reviewed and summarized the referring note, he had a history of schizoaffective disorder, bipolar type, obstructive sleep apnea, epilepsy, prediabetes, on polypharmacy treatment, this including Dilantin 200 mg three times a day, Seroquel 100 mg once daily, 200 mg at night, fluoxetine 20 mg in the morning, lamotrigine 100 mg twice a day, Latuda 20 mg 2 tablets at bedtime, Metformin 500 mg 1 tablets daily,  He had a history of seizures since infant 2 months old, previouslywasunder the care of pediatric neurologist Dr. Sharene Skeans until 2014, he has generalized tonic-clonic seizure, also had seizures of staring, freezing up, then snapped out of it.  He been treated with stable dose of Dilantin 200 mg twice a day, he has been on Dilantin 400 mg daily since 2012, along with lamotrigine. He has seizure every few months, last generalized tonic-clonic seizure was in 2016.  He recently switched to his primary care's office, who has followed up his Dilantin level, Dilantin dose was adjusted based on the level, up to 600 mg daily, while he was he was taking 200/400 mg of Dilantin, he shows signs of toxicity, imbalance, dizziness, level was 26, later the dosage was adjusted to 200 mg 3 times daily, lamotrigine 100 mg twice a day was stopped since May 16, 2018,  He is now on single antiepileptic medications Dilantin ER 100 mg 2 tablets 3 times a day.  CT head without contrast in December 2015 was normal.  UPDATE Sept 9 2019: EEG in July 2018 showed mild generalized slowing, indicating mildbihemisphereno  function.  He is taking lamotrigine 150mg  bidmistakenly with lamotrigine 25 mg twice a day as well, dilantindose was decreased to157m 2 tabs qhs, hehas no recurrent seizure,no longer has excessive daytime sleepiness, drowsiness,  He continues to suffer significant mood disorder, is receiving adjustment of his Seroquel dosage by his psychiatrist.  Mar 23, 2019 SS: Richard Ellison 25 year old male with history of seizures. Has tapered off Dilantin. Is currently taking lamotrigine extended release 300 mg, 1 tablet at bedtime.  He reports he is tolerating the Lamictal well, denies any trouble with his gait or balance  He has been doing well.  He had any recurrent seizures.  He reports he did have a panic attack one time when he went to the grocery store, he recognized it was coming, when outside in the car.  Unfortunately his primary care office, [facility] recently shut down without much notice.  They were prescribing him a prescription for Prozac, trazodone, Seroquel.  He started with a new psychiatrist at [facility].  They would not prescribe his medications, said primary care would have to do so.  He reports he only has a few days left.  Update November 30, 2019 SS: He reports he has been doing well, remains on Lamictal XR 300 mg tablet daily, reports compliance with medication.  He has not had recurrent seizure since 2016.  He is tolerating meds without side effect.  He is now established with a new primary care doctor.  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  Lamictal level 06/16/2019 was < 1.  Update December 03, 2020 SS:  REVIEW OF SYSTEMS: Out of a complete 14 system review of symptoms, the patient complains only of the following symptoms, and all other reviewed systems are negative.  Seizures  ALLERGIES: No Known Allergies  HOME MEDICATIONS: Outpatient Medications Prior to Visit  Medication Sig  Dispense Refill  . LamoTRIgine 300 MG TB24 24 hour tablet Take 1 tablet (300 mg total) by mouth at bedtime. Please discard his previous lamotrigine 150mg  bid prescription 90 tablet 4  . nicotine polacrilex (NICORETTE) 2 MG gum Take 1 each (2 mg total) by mouth as needed for smoking cessation (no more than 30 pieces in a day). 100 tablet 1  . traZODone (DESYREL) 50 MG tablet TAKE ONE TABLET BY MOUTH AT BEDTIME FOR insomnia 30 tablet 1   No facility-administered medications prior to visit.    PAST MEDICAL HISTORY: Past Medical History:  Diagnosis Date  . Aspergers' syndrome   . Autism   . Bipolar 1 disorder (HCC)   . OSA on CPAP   . Pre-diabetes   . Seizures (HCC)     PAST SURGICAL HISTORY: Past Surgical History:  Procedure Laterality Date  . ADENOIDECTOMY    . INNER EAR SURGERY      FAMILY HISTORY: Family History  Problem Relation Age of Onset  . Healthy Mother   . Other Father        unsure of health status  . Diabetes Paternal Grandmother     SOCIAL HISTORY: Social History   Socioeconomic History  . Marital status: Single    Spouse name: Not on file  . Number of children: 0  . Years of education: 68  . Highest education level: High school graduate  Occupational History  . Occupation: Disabled  Tobacco Use  . Smoking status: Light Tobacco Smoker  . Smokeless tobacco: Never Used  Vaping Use  . Vaping Use: Never used  Substance and Sexual Activity  . Alcohol use: No  . Drug use: No  . Sexual activity: Not on file  Other Topics Concern  . Not on file  Social History Narrative   Lives at home with his mother.   Left-handed.   Occasional caffeine use.   Social Determinants of Health   Financial Resource Strain: Not on file  Food Insecurity: Not on file  Transportation Needs: Not on file  Physical Activity: Not on file  Stress: Not on file  Social Connections: Not on file  Intimate Partner Violence: Not on file   PHYSICAL EXAM  There were no vitals  filed for this visit. There is no height or weight on file to calculate BMI.  Generalized: Well developed, in no acute distress   Neurological examination  Mentation: Alert oriented to time, place, history taking. Follows all commands speech and language fluent Cranial nerve II-XII: Pupils were equal round reactive to light. Extraocular movements were full, visual field were full on confrontational test. Facial sensation and strength were normal.  Head turning and shoulder shrug  were normal and symmetric. Motor: The motor testing reveals 5 over 5 strength of all 4 extremities. Good symmetric motor tone is noted throughout.  Sensory: Sensory testing is intact to soft touch on all 4 extremities. No evidence of extinction is noted.  Coordination: Cerebellar testing reveals good finger-nose-finger and heel-to-shin bilaterally.  Gait and station: Gait is normal. Tandem gait is normal.  Reflexes: Deep tendon reflexes are symmetric and  normal bilaterally.   DIAGNOSTIC DATA (LABS, IMAGING, TESTING) - I reviewed patient records, labs, notes, testing and imaging myself where available.  Lab Results  Component Value Date   WBC 8.2 07/09/2020   HGB 14.2 07/09/2020   HCT 44.8 07/09/2020   MCV 81 07/09/2020   PLT 271 07/09/2020      Component Value Date/Time   NA 137 07/09/2020 1659   K 4.4 07/09/2020 1659   CL 101 07/09/2020 1659   CO2 26 07/09/2020 1659   GLUCOSE 100 (H) 07/09/2020 1659   GLUCOSE 117 (H) 10/30/2014 0707   BUN 10 07/09/2020 1659   CREATININE 1.27 07/09/2020 1659   CALCIUM 9.6 07/09/2020 1659   PROT 7.9 07/09/2020 1659   ALBUMIN 4.8 07/09/2020 1659   AST 18 07/09/2020 1659   ALT 13 07/09/2020 1659   ALKPHOS 82 07/09/2020 1659   BILITOT 0.5 07/09/2020 1659   GFRNONAA 77 07/09/2020 1659   GFRAA 90 07/09/2020 1659   Lab Results  Component Value Date   CHOL 107 07/09/2020   HDL 36 (L) 07/09/2020   LDLCALC 47 07/09/2020   TRIG 138 07/09/2020   CHOLHDL 3.0 07/09/2020    Lab Results  Component Value Date   HGBA1C 6.2 (H) 07/09/2020   No results found for: [id number] Lab Results  Component Value Date   TSH 0.530 09/18/2019   ASSESSMENT AND PLAN 25 y.o. year old male  has a past medical history of Aspergers' syndrome, Autism, Bipolar 1 disorder (HCC), OSA on CPAP, Pre-diabetes, and Seizures (HCC). here with:  1.  Seizures 2.  Mood disorder  He has continued to do well.  He has not had recurrent seizure.  He will remain on Lamictal XR 300 mg tablet.  I will check a Lamictal level today, in August, the level was 0.  He now has follow-up with a new primary care doctor, who is managing his daily medications.  It seems he has been able to come off the Seroquel and Prozac.  He will call for recurrent seizure, otherwise follow-up in 1 year or sooner if needed.   I spent 15 minutes with the patient. 50% of this time was spent this plan of care.   Margie Ege, AGNP-C, DNP 12/03/2020, 5:54 AM [facility] [address] [place], [place] [address] [phone]

## 2020-12-23 ENCOUNTER — Telehealth: Payer: Self-pay | Admitting: Neurology

## 2020-12-23 NOTE — Telephone Encounter (Signed)
Pt. Has an aoot scheduled for 03/05/21 but needs his medications (Trazodone,Lamotrigine, and Seroquil) refilled sooner. Best call back is [phone]

## 2020-12-23 NOTE — Telephone Encounter (Signed)
I called and spoke to pt.  He states need refills on lmaictal ER 300mg  po qhs (last received 11-08-20 #90 by Dr. 11-10-20, Trazodone 50mg  po daily 10/31/20 #90 dr , seroquel 100mg  last fill 11/02/20 #14 Dr. Acey Lav (in 10/2020 ED visit).  Pt states he is needing all refills.  I confirmed above meds from [facility] [address].  Pt did not know who he see's at [facility]. I LMVM for home # trying to reach mother of pt.

## 2020-12-24 NOTE — Telephone Encounter (Signed)
You can fill the Lamictal, the rest should come from psych or PCP. I have refilled in the past when he wasn't established with psych or PCP.

## 2020-12-24 NOTE — Telephone Encounter (Signed)
I spoke to mother.  She was told that pt was going to be on both seroquel and trazodone from [facility] visit when they were on phone call.  Per [facility] he is taking all three, but has run out of seroquel.  See's [facility] in [place] Dr. B [phone], and [facility] [place].  Dr. Jonah Blue still pcp.  Needs refills on seroquel. Please advise.

## 2020-12-25 NOTE — Telephone Encounter (Signed)
Noted.  Mother to call and speak to Dr. Leonard Schwartz at [facility] regarding any psych refills.

## 2020-12-25 NOTE — Telephone Encounter (Signed)
Called and LMVM for [facility]/ nurse line [phone] to please return call re: his medications seroquel and trazodone.

## 2020-12-25 NOTE — Telephone Encounter (Signed)
I called pt and mother of pt.  I relayed that lamictal will be prescribed by [place] for his seizures.  seroquel and trazodone to be done by psychiatry or pcp.  I relayed that we have done in the past while being established by psych and pcp.  I did LM for [facility] RN to call me back and LM about what it was for.  Mother of pt will also call as well.  She verbalized understanding.

## 2020-12-25 NOTE — Telephone Encounter (Signed)
Maybe psych could provide refill in the interim or PCP. I think these should really come from psych.

## 2020-12-25 NOTE — Telephone Encounter (Signed)
I spoke to [place] with [facility], they have protocol about after 6 months pt is usually discharged. He slipped thru cracks and his is open active case.  She states that he needs to come in to [facility] [place] office 0830 and have clinical assessment done, then if early enough  Can have psych eval done and then would be able to get prescriptions for him.  I relayed this to mother.  She will call and speak to Dr. Leonard Schwartz.  Has had VV with Dr. Leonard Schwartz.  She will call today.  Needs his medication.  Nettie Elm asked about bridging pt with medication for 30 days.  I note the seroquel is the only one needed at this time.  I was not sure if SS/NP would do this, as it is is psych med and they really need to f/u to get this finalized.

## 2021-01-24 ENCOUNTER — Emergency Department (HOSPITAL_BASED_OUTPATIENT_CLINIC_OR_DEPARTMENT_OTHER)
Admission: EM | Admit: 2021-01-24 | Discharge: 2021-01-24 | Disposition: A | Discharge status: Home / Self Care | Payer: Medicaid Other | Attending: Emergency Medicine | Admitting: Emergency Medicine

## 2021-01-24 ENCOUNTER — Emergency Department (HOSPITAL_BASED_OUTPATIENT_CLINIC_OR_DEPARTMENT_OTHER): Payer: Medicaid Other

## 2021-01-24 ENCOUNTER — Encounter (HOSPITAL_BASED_OUTPATIENT_CLINIC_OR_DEPARTMENT_OTHER): Payer: Self-pay | Admitting: *Deleted

## 2021-01-24 ENCOUNTER — Other Ambulatory Visit: Payer: Self-pay

## 2021-01-24 DIAGNOSIS — R519 Headache, unspecified: Secondary | ICD-10-CM | POA: Insufficient documentation

## 2021-01-24 DIAGNOSIS — R55 Syncope and collapse: Secondary | ICD-10-CM | POA: Insufficient documentation

## 2021-01-24 DIAGNOSIS — F840 Autistic disorder: Secondary | ICD-10-CM | POA: Insufficient documentation

## 2021-01-24 DIAGNOSIS — R079 Chest pain, unspecified: Secondary | ICD-10-CM | POA: Diagnosis not present

## 2021-01-24 DIAGNOSIS — R0789 Other chest pain: Secondary | ICD-10-CM

## 2021-01-24 DIAGNOSIS — F17200 Nicotine dependence, unspecified, uncomplicated: Secondary | ICD-10-CM | POA: Diagnosis not present

## 2021-01-24 LAB — CBC WITH DIFFERENTIAL/PLATELET
Abs Immature Granulocytes: 0.04 10*3/uL (ref 0.00–0.07)
Basophils Absolute: 0 10*3/uL (ref 0.0–0.1)
Basophils Relative: 0 %
Eosinophils Absolute: 0.1 10*3/uL (ref 0.0–0.5)
Eosinophils Relative: 1 %
HCT: 45.5 % (ref 39.0–52.0)
Hemoglobin: 14.7 g/dL (ref 13.0–17.0)
Immature Granulocytes: 0 %
Lymphocytes Relative: 24 %
Lymphs Abs: 2.6 10*3/uL (ref 0.7–4.0)
MCH: 26.3 pg (ref 26.0–34.0)
MCHC: 32.3 g/dL (ref 30.0–36.0)
MCV: 81.3 fL (ref 80.0–100.0)
Monocytes Absolute: 0.6 10*3/uL (ref 0.1–1.0)
Monocytes Relative: 6 %
Neutro Abs: 7.2 10*3/uL (ref 1.7–7.7)
Neutrophils Relative %: 69 %
Platelets: 276 10*3/uL (ref 150–400)
RBC: 5.6 MIL/uL (ref 4.22–5.81)
RDW: 13.2 % (ref 11.5–15.5)
WBC: 10.5 10*3/uL (ref 4.0–10.5)
nRBC: 0 % (ref 0.0–0.2)

## 2021-01-24 LAB — BASIC METABOLIC PANEL
Anion gap: 9 (ref 5–15)
BUN: 13 mg/dL (ref 6–20)
CO2: 26 mmol/L (ref 22–32)
Calcium: 9.1 mg/dL (ref 8.9–10.3)
Chloride: 102 mmol/L (ref 98–111)
Creatinine, Ser: 1.21 mg/dL (ref 0.61–1.24)
GFR, Estimated: >60 mL/min (ref >60)
Glucose, Bld: 105 mg/dL — ABNORMAL HIGH (ref 70–99)
Potassium: 3.4 mmol/L — ABNORMAL LOW (ref 3.5–5.1)
Sodium: 137 mmol/L (ref 135–145)

## 2021-01-24 LAB — TROPONIN I (HIGH SENSITIVITY): Troponin I (High Sensitivity): 2 ng/L (ref <18)

## 2021-01-24 NOTE — ED Triage Notes (Signed)
Pt. Richard Ellison he was going to the store and he felt his face feel pressure and then he fell at the car and then he remembers being down at the car and it took all his energy to get up.  Now he feels extremely tired.    Pt. Moves all extremities well with no difficulties.  Pt. Does not move extremities when asked.  Pt. Continues to complain of a headache.

## 2021-01-24 NOTE — ED Triage Notes (Signed)
Per EMS Pt. Mother called EMS for Pt. Having a Panic Attack .Marland Kitchen Pt. Mother reports Pt. Has history of seizures but no seizure noted and the Pt. On scene was sitting beside the car.  Pt. In triage reports he hit his head however no noted knot felt and no bruising noted on his face or any neck area noted with edema nor knots.  Pt. Reports no neck pain only front face pain that he has had before the actual incident of falling out beside the car.   Pt. Mother told EMS the Pt. Had an argument with his girlfriend today and has been anxious.

## 2021-01-24 NOTE — ED Provider Notes (Signed)
[facility] EMERGENCY DEPARTMENT Provider Note   CSN: [id number] Arrival date & time: 01/24/21  0041     History Chief Complaint  Patient presents with  . Chest Pain    Richard Ellison is a 25 y.o. male.  Patient is a 25 year old male with past medical history of Asperger's, autism, bipolar, seizures, and obesity.  Patient brought by EMS for evaluation of some sort of panic versus seizure versus syncopal episode.  Patient was walking to the store when he states he felt disoriented, dizzy, then fell backward.  He is describing pain in his chest and the back of his head.  Patient was brought here by EMS for evaluation of this.  I am also told that he had an argument with his girlfriend today and has been anxious since.  Patient somewhat of a poor historian but does describe headache and ongoing chest pain after this episode.  The history is provided by the patient.       Past Medical History:  Diagnosis Date  . Aspergers' syndrome   . Autism   . Bipolar 1 disorder (HCC)   . OSA on CPAP   . Pre-diabetes   . Seizures [facility])     Patient Active Problem List   Diagnosis Date Noted  . Elevated blood-pressure reading without diagnosis of hypertension 07/10/2020  . Influenza vaccination declined 07/10/2020  . Insomnia 01/18/2020  . Former smoker 09/18/2019  . Bipolar affective disorder, depressed, moderate (HCC) 06/16/2019  . Panic disorder 06/16/2019  . OSA on CPAP 06/16/2019  . Tobacco dependence 06/16/2019  . Prediabetes 06/16/2019  . Obesity (BMI 30-39.9) 06/16/2019  . Seizure disorder (HCC) 05/31/2018    Past Surgical History:  Procedure Laterality Date  . ADENOIDECTOMY    . INNER EAR SURGERY         Family History  Problem Relation Age of Onset  . Healthy Mother   . Other Father        unsure of health status  . Diabetes Paternal Grandmother     Social History   Tobacco Use  . Smoking status: Light Tobacco Smoker  . Smokeless tobacco: Never  Used  Vaping Use  . Vaping Use: Never used  Substance Use Topics  . Alcohol use: No  . Drug use: No    Home Medications Prior to Admission medications   Medication Sig Start Date End Date Taking? Authorizing Provider  LamoTRIgine 300 MG TB24 24 hour tablet Take 1 tablet (300 mg total) by mouth at bedtime. Please discard his previous lamotrigine 150mg  bid prescription 11/30/19   12/02/19, NP  nicotine polacrilex (NICORETTE) 2 MG gum Take 1 each (2 mg total) by mouth as needed for smoking cessation (no more than 30 pieces in a day). 09/18/19   13/9/20, MD  traZODone (DESYREL) 50 MG tablet TAKE ONE TABLET BY MOUTH AT BEDTIME FOR insomnia 05/10/19   07/11/19, NP    Allergies    Patient has no known allergies.  Review of Systems   Review of Systems  All other systems reviewed and are negative.   Physical Exam Updated Vital Signs BP 122/88   Pulse (!) 51   Temp 98.4 F (36.9 C) (Oral)   Resp 16   Ht 6\' 2"  (1.88 m)   Wt 129.3 kg   SpO2 99%   BMI 36.59 kg/m   Physical Exam Vitals and nursing note reviewed.  Constitutional:      General: He is not  in acute distress.    Appearance: He is well-developed. He is not diaphoretic.  HENT:     Head: Normocephalic and atraumatic.  Eyes:     Extraocular Movements: Extraocular movements intact.     Pupils: Pupils are equal, round, and reactive to light.  Cardiovascular:     Rate and Rhythm: Normal rate and regular rhythm.     Heart sounds: No murmur heard. No friction rub.     Comments: There is tenderness to palpation over the anterior chest wall that reproduces his symptoms. Pulmonary:     Effort: Pulmonary effort is normal. No respiratory distress.     Breath sounds: Normal breath sounds. No wheezing or rales.  Abdominal:     General: Bowel sounds are normal. There is no distension.     Palpations: Abdomen is soft.     Tenderness: There is no abdominal tenderness.  Musculoskeletal:        General: Normal  range of motion.     Cervical back: Normal range of motion and neck supple.  Skin:    General: Skin is warm and dry.  Neurological:     General: No focal deficit present.     Mental Status: He is alert and oriented to person, place, and time.     Cranial Nerves: No cranial nerve deficit.     Motor: No weakness.     Coordination: Coordination normal.     ED Results / Procedures / Treatments   Labs (all labs ordered are listed, but only abnormal results are displayed) Labs Reviewed - No data to display  EKG EKG Interpretation  Date/Time:  Friday January 24 2021 01:18:17 EDT Ventricular Rate:  53 PR Interval:  180 QRS Duration: 88 QT Interval:  442 QTC Calculation: 414 R Axis:   71 Text Interpretation: Sinus bradycardia Otherwise normal ecg Confirmed by Geoffery Lyons ([id number]) on 01/24/2021 1:37:22 AM   Radiology No results found.  Procedures Procedures   Medications Ordered in ED Medications - No data to display  ED Course  I have reviewed the triage vital signs and the nursing notes.  Pertinent labs & imaging results that were available during my care of the patient were reviewed by me and considered in my medical decision making (see chart for details).    MDM Rules/Calculators/A&P  Patient is a 25 year old male with above stated medical history presenting after an episode as described in the HPI.  Patient arrives here complaining of chest pain and headache and disorientation.  I am uncertain as to what exactly triggered this episode, but I am told he had a stressful argument with his girlfriend.  Patient has had no seizure activity while here.  He seems at his baseline.  His work-up is unremarkable including CT of the head, EKG, chest x-ray, and laboratory studies.  I highly doubt a cardiac etiology and feel as though discharge is appropriate.  Final Clinical Impression(s) / ED Diagnoses Final diagnoses:  None    Rx / DC Orders ED Discharge Orders    None        Geoffery Lyons, MD 01/24/21 [phone]

## 2021-01-24 NOTE — Discharge Instructions (Addendum)
Continue medications as previously prescribed. ° °Follow-up with your primary doctor in the next few days. °

## 2021-02-06 ENCOUNTER — Ambulatory Visit: Payer: Medicaid Other | Admitting: Internal Medicine

## 2021-03-05 ENCOUNTER — Ambulatory Visit: Payer: Medicaid Other | Admitting: Neurology

## 2021-03-05 NOTE — Progress Notes (Deleted)
PATIENT: Richard Ellison DOB: [birth year]  REASON FOR VISIT: follow up HISTORY FROM: patient  HISTORY OF PRESENT ILLNESS: Today 03/05/21  HISTORY Richard Capistran Johnsonis a 25 years old male, seen in request by his primary care nurse practitioner [facility] for evaluation of seizure, is accompanied by his mother [facility] Ellison at today's visit, initial evaluation was on May 31, 2018.  I reviewed and summarized the referring note, he had a history of schizoaffective disorder, bipolar type, obstructive sleep apnea, epilepsy, prediabetes, on polypharmacy treatment, this including Dilantin 200 mg three times a day, Seroquel 100 mg once daily, 200 mg at night, fluoxetine 20 mg in the morning, lamotrigine 100 mg twice a day, Latuda 20 mg 2 tablets at bedtime, Metformin 500 mg 1 tablets daily,  He had a history of seizures since infant 2 months old, previouslywasunder the care of pediatric neurologist [facility] until 2014, he has generalized tonic-clonic seizure, also had seizures of staring, freezing up, then snapped out of it.  He been treated with stable dose of Dilantin 200 mg twice a day, he has been on Dilantin 400 mg daily since 2012, along with lamotrigine. He has seizure every few months, last generalized tonic-clonic seizure was in 2016.  He recently switched to his primary care's office, who has followed up his Dilantin level, Dilantin dose was adjusted based on the level, up to 600 mg daily, while he was he was taking 200/400 mg of Dilantin, he shows signs of toxicity, imbalance, dizziness, level was 26, later the dosage was adjusted to 200 mg 3 times daily, lamotrigine 100 mg twice a day was stopped since May 16, 2018,  He is now on single antiepileptic medications Dilantin ER 100 mg 2 tablets 3 times a day.  CT head without contrast in December 2015 was normal.  UPDATE Sept 9 2019: EEG in July 2018 showed mild generalized slowing, indicating mildbihemisphereno  function.  He is taking lamotrigine 150mg  bidmistakenly with lamotrigine 25 mg twice a day as well, dilantindose was decreased to158m 2 tabs qhs, hehas no recurrent seizure,no longer has excessive daytime sleepiness, drowsiness,  He continues to suffer significant mood disorder, is receiving adjustment of his Seroquel dosage by his psychiatrist.  Mar 23, 2019 SS: [facility]ison 25 year old male with history of seizures. Has tapered off Dilantin. Is currently taking lamotrigine extended release 300 mg, 1 tablet at bedtime.  He reports he is tolerating the Lamictal well, denies any trouble with his gait or balance  He has been doing well.  He had any recurrent seizures.  He reports he did have a panic attack one time when he went to the grocery store, he recognized it was coming, when outside in the car.  Unfortunately his primary care office, [facility] recently shut down without much notice.  They were prescribing him a prescription for Prozac, trazodone, Seroquel.  He started with a new psychiatrist at [facility].  They would not prescribe his medications, said primary care would have to do so.  He reports he only has a few days left.  Update November 30, 2019 SS: He reports he has been doing well, remains on Lamictal XR 300 mg tablet daily, reports compliance with medication.  He has not had recurrent seizure since 2016.  He is tolerating meds without side effect.  He is now established with a new primary care doctor.  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  Lamictal level 06/16/2019 was < 1.  Update March 05, 2021 SS:   REVIEW OF SYSTEMS: Out of a complete 14 system review of symptoms, the patient complains only of the following symptoms, and all other reviewed systems are negative.  Seizures  ALLERGIES: No Known Allergies  HOME MEDICATIONS: Outpatient Medications Prior to Visit  Medication Sig  Dispense Refill  . LamoTRIgine 300 MG TB24 24 hour tablet Take 1 tablet (300 mg total) by mouth at bedtime. Please discard his previous lamotrigine 150mg  bid prescription 90 tablet 4  . nicotine polacrilex (NICORETTE) 2 MG gum Take 1 each (2 mg total) by mouth as needed for smoking cessation (no more than 30 pieces in a day). 100 tablet 1  . traZODone (DESYREL) 50 MG tablet TAKE ONE TABLET BY MOUTH AT BEDTIME FOR insomnia 30 tablet 1   No facility-administered medications prior to visit.    PAST MEDICAL HISTORY: Past Medical History:  Diagnosis Date  . Aspergers' syndrome   . Autism   . Bipolar 1 disorder (HCC)   . OSA on CPAP   . Pre-diabetes   . Seizures (HCC)     PAST SURGICAL HISTORY: Past Surgical History:  Procedure Laterality Date  . ADENOIDECTOMY    . INNER EAR SURGERY      FAMILY HISTORY: Family History  Problem Relation Age of Onset  . Healthy Mother   . Other Father        unsure of health status  . Diabetes Paternal Grandmother     SOCIAL HISTORY: Social History   Socioeconomic History  . Marital status: Single    Spouse name: Not on file  . Number of children: 0  . Years of education: 41  . Highest education level: [employer] school graduate  Occupational History  . Occupation: Disabled  Tobacco Use  . Smoking status: Light Tobacco Smoker  . Smokeless tobacco: Never Used  Vaping Use  . Vaping Use: Never used  Substance and Sexual Activity  . Alcohol use: No  . Drug use: No  . Sexual activity: Not on file  Other Topics Concern  . Not on file  Social History Narrative   Lives at home with his mother.   Left-handed.   Occasional caffeine use.   Social Determinants of Health   Financial Resource Strain: Not on file  Food Insecurity: Not on file  Transportation Needs: Not on file  Physical Activity: Not on file  Stress: Not on file  Social Connections: Not on file  Intimate Partner Violence: Not on file   PHYSICAL EXAM  There were no vitals  filed for this visit. There is no height or weight on file to calculate BMI.  Generalized: Well developed, in no acute distress   Neurological examination  Mentation: Alert oriented to time, place, history taking. Follows all commands speech and language fluent Cranial nerve II-XII: Pupils were equal round reactive to light. Extraocular movements were full, visual field were full on confrontational test. Facial sensation and strength were normal.  Head turning and shoulder shrug  were normal and symmetric. Motor: The motor testing reveals 5 over 5 strength of all 4 extremities. Good symmetric motor tone is noted throughout.  Sensory: Sensory testing is intact to soft touch on all 4 extremities. No evidence of extinction is noted.  Coordination: Cerebellar testing reveals good finger-nose-finger and heel-to-shin bilaterally.  Gait and station: Gait is normal. Tandem gait is normal.  Reflexes: Deep tendon reflexes are symmetric  and normal bilaterally.   DIAGNOSTIC DATA (LABS, IMAGING, TESTING) - I reviewed patient records, labs, notes, testing and imaging myself where available.  Lab Results  Component Value Date   WBC 10.5 01/24/2021   HGB 14.7 01/24/2021   HCT 45.5 01/24/2021   MCV 81.3 01/24/2021   PLT 276 01/24/2021      Component Value Date/Time   NA 137 01/24/2021 0158   NA 137 07/09/2020 1659   K 3.4 (L) 01/24/2021 0158   CL 102 01/24/2021 0158   CO2 26 01/24/2021 0158   GLUCOSE 105 (H) 01/24/2021 0158   BUN 13 01/24/2021 0158   BUN 10 07/09/2020 1659   CREATININE 1.21 01/24/2021 0158   CALCIUM 9.1 01/24/2021 0158   PROT 7.9 07/09/2020 1659   ALBUMIN 4.8 07/09/2020 1659   AST 18 07/09/2020 1659   ALT 13 07/09/2020 1659   ALKPHOS 82 07/09/2020 1659   BILITOT 0.5 07/09/2020 1659   GFRNONAA >60 01/24/2021 0158   GFRAA 90 07/09/2020 1659   Lab Results  Component Value Date   CHOL 107 07/09/2020   HDL 36 (L) 07/09/2020   LDLCALC 47 07/09/2020   TRIG 138 07/09/2020    CHOLHDL 3.0 07/09/2020   Lab Results  Component Value Date   HGBA1C 6.2 (H) 07/09/2020   No results found for: [id number] Lab Results  Component Value Date   TSH 0.530 09/18/2019   ASSESSMENT AND PLAN 25 y.o. year old male  has a past medical history of Aspergers' syndrome, Autism, Bipolar 1 disorder (HCC), OSA on CPAP, Pre-diabetes, and Seizures (HCC). here with:  1.  Seizures 2.  Mood disorder  He has continued to do well.  He has not had recurrent seizure.  He will remain on Lamictal XR 300 mg tablet.  I will check a Lamictal level today, in August, the level was 0.  He now has follow-up with a new primary care doctor, who is managing his daily medications.  It seems he has been able to come off the Seroquel and Prozac.  He will call for recurrent seizure, otherwise follow-up in 1 year or sooner if needed.   I spent 15 minutes with the patient. 50% of this time was spent this plan of care.   Margie Ege, AGNP-C, DNP 03/05/2021, 5:21 AM [facility] [address], [address] [place], [place] [address] [phone]

## 2021-04-15 ENCOUNTER — Telehealth: Payer: Medicaid Other | Admitting: Internal Medicine

## 2021-04-15 ENCOUNTER — Ambulatory Visit: Payer: Medicaid Other | Attending: Internal Medicine | Admitting: Internal Medicine

## 2021-04-15 ENCOUNTER — Other Ambulatory Visit: Payer: Self-pay

## 2021-04-15 DIAGNOSIS — G40909 Epilepsy, unspecified, not intractable, without status epilepticus: Secondary | ICD-10-CM

## 2021-04-15 DIAGNOSIS — F314 Bipolar disorder, current episode depressed, severe, without psychotic features: Secondary | ICD-10-CM

## 2021-04-15 MED ORDER — TRAZODONE HCL 50 MG PO TABS: ORAL_TABLET | ORAL | 3 refills | Status: DC

## 2021-04-15 MED ORDER — LAMOTRIGINE ER 300 MG PO TB24: 300.0000 mg | ORAL_TABLET | Freq: Every day | ORAL | 4 refills | Status: DC

## 2021-04-15 NOTE — Progress Notes (Signed)
Virtual Visit via Telephone Note  I connected with Richard Ellison on 04/15/2021 at 12:23 PM by telephone and verified that I am speaking with the correct person using two identifiers  Location: Patient: home Provider: office  Participants: Myself Patient   I discussed the limitations, risks, security and privacy concerns of performing an evaluation and management service by telephone and the availability of in person appointments. I also discussed with the patient that there may be a patient responsible charge related to this service. The patient expressed understanding and agreed to proceed.   History of Present Illness: Grand mal and focal Sz disorder, OSA on CPAP, Bipolar depression,previous suicide attempts (hanging, over dose, drowning),Pre-DM, panic attacks,Tob dep.Last eval 06/2020.   Reports being out of Lamictal and trazodone x 1 mth and wants to know if I can refill for him..  Both meds usually filled by neurologist but he has not seen her in a while.  He has not had any seizures.  Bipolar dep: reports his mood has been all over the place.  Had stopped going to [facility] because there are times when he feels overwhelm being around and talking to people. Reports he had called [facility] last month and requested an appointment.  When they call him back to schedule the appointment he states he did not return the call but plans to go by there tomorrow to get appointment to resume care.  Outpatient Encounter Medications as of 04/15/2021  Medication Sig  . LamoTRIgine 300 MG TB24 24 hour tablet Take 1 tablet (300 mg total) by mouth at bedtime. Please discard his previous lamotrigine 150mg  bid prescription  . nicotine polacrilex (NICORETTE) 2 MG gum Take 1 each (2 mg total) by mouth as needed for smoking cessation (no more than 30 pieces in a day).  . traZODone (DESYREL) 50 MG tablet TAKE ONE TABLET BY MOUTH AT BEDTIME FOR insomnia   No facility-administered encounter medications on file as of  04/15/2021.       Observations/Objective: No direct observation done as this was a telephone encounter.  Assessment and Plan: 1. Seizure disorder (HCC) I will refill the Lamictal - LamoTRIgine 300 MG TB24 24 hour tablet; Take 1 tablet (300 mg total) by mouth at bedtime.  Dispense: 30 tablet; Refill: 4  2. Bipolar disorder, current episode depressed, severe, without psychotic features (HCC) Encouraged him to get back in with his psychiatrist at [facility].  He tells me he plans to go there tomorrow to get a follow-up appointment.  I have refilled the trazodone.   Follow Up Instructions: 2 mths   I discussed the assessment and treatment plan with the patient. The patient was provided an opportunity to ask questions and all were answered. The patient agreed with the plan and demonstrated an understanding of the instructions.   The patient was advised to call back or seek an in-person evaluation if the symptoms worsen or if the condition fails to improve as anticipated.  I  Spent 10 minutes on this telephone encounter  [facility], MD

## 2021-05-21 ENCOUNTER — Encounter: Payer: Self-pay | Admitting: Neurology

## 2021-05-21 ENCOUNTER — Ambulatory Visit: Payer: Medicaid Other | Admitting: Neurology

## 2021-05-21 DIAGNOSIS — G40909 Epilepsy, unspecified, not intractable, without status epilepticus: Secondary | ICD-10-CM | POA: Diagnosis not present

## 2021-05-21 MED ORDER — LAMOTRIGINE ER 300 MG PO TB24: 300.0000 mg | ORAL_TABLET | Freq: Every day | ORAL | 4 refills | Status: DC

## 2021-05-21 NOTE — Progress Notes (Deleted)
PATIENT: Richard Ellison DOB: 05/27/1996  REASON FOR VISIT: follow up HISTORY FROM: patient  HISTORY OF PRESENT ILLNESS: Today 05/21/21  HISTORY Richard Ellison is a 25 years old male, seen in request by his primary care nurse practitioner Richard Ellison for evaluation of seizure, is accompanied by his mother Richard Ellison at today's visit, initial evaluation was on May 31, 2018.   I reviewed and summarized the referring note, he had a history of schizoaffective disorder, bipolar type, obstructive sleep apnea, epilepsy, prediabetes, on polypharmacy treatment, this including Dilantin 200 mg three times a day, Seroquel 100 mg once daily, 200 mg at night, fluoxetine 20 mg in the morning, lamotrigine 100 mg twice a day, Latuda 20 mg 2 tablets at bedtime, Metformin 500 mg 1 tablets daily,   He had a history of seizures since infant 2 months old, previously was  under the care of pediatric neurologist Richard Ellison until 2014, he has generalized tonic-clonic seizure, also had seizures of staring, freezing up, then snapped out of it.   He been treated with stable dose of Dilantin 200 mg twice a day,  he has been on Dilantin 400 mg daily since 2012, along with lamotrigine.  He has seizure every few months, last generalized tonic-clonic seizure was in 2016.   He recently switched to his primary care's office, who has followed up his Dilantin level, Dilantin dose was adjusted based on the level, up to 600 mg daily, while  he was he was taking 200/400 mg of Dilantin, he shows signs of toxicity, imbalance, dizziness, level was 26, later the dosage was adjusted to 200 mg 3 times daily, lamotrigine 100 mg twice a day was stopped since May 16, 2018,   He is now on single antiepileptic medications Dilantin ER 100 mg 2 tablets 3 times a day.   CT head without contrast in December 2015 was normal.   UPDATE Sept 9 2019: EEG in July 2018 showed mild generalized slowing, indicating mild bihemisphere no  function.   He is taking lamotrigine 150mg  bid mistakenly with lamotrigine 25 mg twice a day as well, dilantin dose was decreased to 140m 2 tabs qhs, he has no recurrent seizure, no longer has excessive daytime sleepiness, drowsiness,   He continues to suffer significant mood disorder, is receiving adjustment of his Seroquel dosage by his psychiatrist.   Mar 23, 2019 SS: Richard Ellison 25 year old male with history of seizures. Has tapered off Dilantin. Is currently taking lamotrigine extended release 300 mg, 1 tablet at bedtime.  He reports he is tolerating the Lamictal well, denies any trouble with his gait or balance   He has been doing well.  He had any recurrent seizures.  He reports he did have a panic attack one time when he went to the grocery store, he recognized it was coming, when outside in the car.   Unfortunately his primary care office, [facility] recently shut down without much notice.  They were prescribing him a prescription for Prozac, trazodone, Seroquel.  He started with a new psychiatrist at [facility].  They would not prescribe his medications, said primary care would have to do so.  He reports he only has a few days left.  Update November 30, 2019 SS: He reports he has been doing well, remains on Lamictal XR 300 mg tablet daily, reports compliance with medication.  He has not had recurrent seizure since 2016.  He is tolerating meds without side effect.  He is  now established with a new primary care doctor.  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  Lamictal level 06/16/2019 was < 1.  Update May 21, 2021 SS:   REVIEW OF SYSTEMS: Out of a complete 14 system review of symptoms, the patient complains only of the following symptoms, and all other reviewed systems are negative.  Seizures  ALLERGIES: No Known Allergies  HOME MEDICATIONS: Outpatient Medications Prior to Visit  Medication Sig  Dispense Refill   LamoTRIgine 300 MG TB24 24 hour tablet Take 1 tablet (300 mg total) by mouth at bedtime. 30 tablet 4   nicotine polacrilex (NICORETTE) 2 MG gum Take 1 each (2 mg total) by mouth as needed for smoking cessation (no more than 30 pieces in a day). 100 tablet 1   traZODone (DESYREL) 50 MG tablet TAKE ONE TABLET BY MOUTH AT BEDTIME FOR insomnia 30 tablet 3   No facility-administered medications prior to visit.    PAST MEDICAL HISTORY: Past Medical History:  Diagnosis Date   Aspergers' syndrome    Autism    Bipolar 1 disorder (HCC)    OSA on CPAP    Pre-diabetes    Seizures (HCC)     PAST SURGICAL HISTORY: Past Surgical History:  Procedure Laterality Date   ADENOIDECTOMY     INNER EAR SURGERY      FAMILY HISTORY: Family History  Problem Relation Age of Onset   Healthy Mother    Other Father        unsure of health status   Diabetes Paternal Grandmother     SOCIAL HISTORY: Social History   Socioeconomic History   Marital status: Single    Spouse name: Not on file   Number of children: 0   Years of education: 12   Highest education level: High school graduate  Occupational History   Occupation: Disabled  Tobacco Use   Smoking status: Light Smoker    Pack years: 0.00   Smokeless tobacco: Never  Vaping Use   Vaping Use: Never used  Substance and Sexual Activity   Alcohol use: No   Drug use: No   Sexual activity: Not on file  Other Topics Concern   Not on file  Social History Narrative   Lives at home with his mother.   Left-handed.   Occasional caffeine use.   Social Determinants of Health   Financial Resource Strain: Not on file  Food Insecurity: Not on file  Transportation Needs: Not on file  Physical Activity: Not on file  Stress: Not on file  Social Connections: Not on file  Intimate Partner Violence: Not on file   PHYSICAL EXAM  There were no vitals filed for this visit.  There is no height or weight on file to calculate  BMI.  Generalized: Well developed, in no acute distress   Neurological examination  Mentation: Alert oriented to time, place, history taking. Follows all commands speech and language fluent Cranial nerve II-XII: Pupils were equal round reactive to light. Extraocular movements were full, visual field were full on confrontational test. Facial sensation and strength were normal.  Head turning and shoulder shrug  were normal and symmetric. Motor: The motor testing reveals 5 over 5 strength of all 4 extremities. Good symmetric motor tone is noted throughout.  Sensory: Sensory testing is intact to soft touch on all 4 extremities. No evidence of extinction is noted.  Coordination: Cerebellar testing reveals  good finger-nose-finger and heel-to-shin bilaterally.  Gait and station: Gait is normal. Tandem gait is normal.  Reflexes: Deep tendon reflexes are symmetric and normal bilaterally.   DIAGNOSTIC DATA (LABS, IMAGING, TESTING) - I reviewed patient records, labs, notes, testing and imaging myself where available.  Lab Results  Component Value Date   WBC 10.5 01/24/2021   HGB 14.7 01/24/2021   HCT 45.5 01/24/2021   MCV 81.3 01/24/2021   PLT 276 01/24/2021      Component Value Date/Time   NA 137 01/24/2021 0158   NA 137 07/09/2020 1659   K 3.4 (L) 01/24/2021 0158   CL 102 01/24/2021 0158   CO2 26 01/24/2021 0158   GLUCOSE 105 (H) 01/24/2021 0158   BUN 13 01/24/2021 0158   BUN 10 07/09/2020 1659   CREATININE 1.21 01/24/2021 0158   CALCIUM 9.1 01/24/2021 0158   PROT 7.9 07/09/2020 1659   ALBUMIN 4.8 07/09/2020 1659   AST 18 07/09/2020 1659   ALT 13 07/09/2020 1659   ALKPHOS 82 07/09/2020 1659   BILITOT 0.5 07/09/2020 1659   GFRNONAA >60 01/24/2021 0158   GFRAA 90 07/09/2020 1659   Lab Results  Component Value Date   CHOL 107 07/09/2020   HDL 36 (L) 07/09/2020   LDLCALC 47 07/09/2020   TRIG 138 07/09/2020   CHOLHDL 3.0 07/09/2020   Lab Results  Component Value Date   HGBA1C  6.2 (H) 07/09/2020   No results found for: [id number] Lab Results  Component Value Date   TSH 0.530 09/18/2019   ASSESSMENT AND PLAN 25 y.o. year old male  has a past medical history of Aspergers' syndrome, Autism, Bipolar 1 disorder (HCC), OSA on CPAP, Pre-diabetes, and Seizures (HCC). here with:  1.  Seizures 2.  Mood disorder  He has continued to do well.  He has not had recurrent seizure.  He will remain on Lamictal XR 300 mg tablet.  I will check a Lamictal level today, in August, the level was 0.  He now has follow-up with a new primary care doctor, who is managing his daily medications.  It seems he has been able to come off the Seroquel and Prozac.  He will call for recurrent seizure, otherwise follow-up in 1 year or sooner if needed.   I spent 15 minutes with the patient. 50% of this time was spent this plan of care.   Margie Ege, AGNP-C, DNP 05/21/2021, 5:40 AM [facility] [address] [place], [place] [address] [phone]

## 2021-05-21 NOTE — Patient Instructions (Signed)
Continue Lamictal  Check labs today  Call for any seizures See you back in 1 year

## 2021-05-21 NOTE — Progress Notes (Signed)
PATIENT: Richard Ellison DOB: 1996/09/29  REASON FOR VISIT: follow up HISTORY FROM: patient Primary Neurologist: Dr.Yan   HISTORY Richard Ellison is a 25 years old male, seen in request by his primary care nurse practitioner Richard Ellison for evaluation of seizure, is accompanied by his mother Richard Ellison at today's visit, initial evaluation was on May 31, 2018.   I reviewed and summarized the referring note, he had a history of schizoaffective disorder, bipolar type, obstructive sleep apnea, epilepsy, prediabetes, on polypharmacy treatment, this including Dilantin 200 mg three times a day, Seroquel 100 mg once daily, 200 mg at night, fluoxetine 20 mg in the morning, lamotrigine 100 mg twice a day, Latuda 20 mg 2 tablets at bedtime, Metformin 500 mg 1 tablets daily,   He had a history of seizures since infant 2 months old, previously was  under the care of pediatric neurologist Dr. Sharene Skeans until 2014, he has generalized tonic-clonic seizure, also had seizures of staring, freezing up, then snapped out of it.   He been treated with stable dose of Dilantin 200 mg twice a day,  he has been on Dilantin 400 mg daily since 2012, along with lamotrigine.  He has seizure every few months, last generalized tonic-clonic seizure was in 2016.   He recently switched to his primary care's office, who has followed up his Dilantin level, Dilantin dose was adjusted based on the level, up to 600 mg daily, while  he was he was taking 200/400 mg of Dilantin, he shows signs of toxicity, imbalance, dizziness, level was 26, later the dosage was adjusted to 200 mg 3 times daily, lamotrigine 100 mg twice a day was stopped since May 16, 2018,   He is now on single antiepileptic medications Dilantin ER 100 mg 2 tablets 3 times a day.   CT head without contrast in December 2015 was normal.   UPDATE Sept 9 2019: EEG in July 2018 showed mild generalized slowing, indicating mild bihemisphere no function.   He is  taking lamotrigine 150mg  bid mistakenly with lamotrigine 25 mg twice a day as well, dilantin dose was decreased to 148m 2 tabs qhs, he has no recurrent seizure, no longer has excessive daytime sleepiness, drowsiness,   He continues to suffer significant mood disorder, is receiving adjustment of his Seroquel dosage by his psychiatrist.   Mar 23, 2019 SS: Richard Ellison 25 year old male with history of seizures. Has tapered off Dilantin. Is currently taking lamotrigine extended release 300 mg, 1 tablet at bedtime.  He reports he is tolerating the Lamictal well, denies any trouble with his gait or balance   He has been doing well.  He had any recurrent seizures.  He reports he did have a panic attack one time when he went to the grocery store, he recognized it was coming, when outside in the car.   Unfortunately his primary care office, [facility] recently shut down without much notice.  They were prescribing him a prescription for Prozac, trazodone, Seroquel.  He started with a new psychiatrist at [facility].  They would not prescribe his medications, said primary care would have to do so.  He reports he only has a few days left.  Update November 30, 2019 SS: He reports he has been doing well, remains on Lamictal XR 300 mg tablet daily, reports compliance with medication.  He has not had recurrent seizure since 2016.  He is tolerating meds without side effect.  He is now established with  a new primary care doctor.  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  Lamictal level 06/16/2019 was < 1.  Update May 21, 2021 SS: Here today alone, trying to make better life choices, on spiritual journey, weight loss, lost 40 lbs, made diet modifications. No seizures. Taking Lamictal XR 300 mg at bedtime, seeing psychiatry, at [facility] in [facility]. Off Seroquel now, just on trazodone. Mood has been good. Living with mom, taking a break from  school at [employer], too much anxiety. Doesn't drive. Came by Medicaid transportation today. Using CPAP. Last seizure was summer 2016 reportedly.   REVIEW OF SYSTEMS: Out of a complete 14 system review of symptoms, the patient complains only of the following symptoms, and all other reviewed systems are negative.  See HPI  ALLERGIES: No Known Allergies  HOME MEDICATIONS: Outpatient Medications Prior to Visit  Medication Sig Dispense Refill   nicotine polacrilex (NICORETTE) 2 MG gum Take 1 each (2 mg total) by mouth as needed for smoking cessation (no more than 30 pieces in a day). 100 tablet 1   traZODone (DESYREL) 50 MG tablet TAKE ONE TABLET BY MOUTH AT BEDTIME FOR insomnia 30 tablet 3   LamoTRIgine 300 MG TB24 24 hour tablet Take 1 tablet (300 mg total) by mouth at bedtime. 30 tablet 4   No facility-administered medications prior to visit.    PAST MEDICAL HISTORY: Past Medical History:  Diagnosis Date   Aspergers' syndrome    Autism    Bipolar 1 disorder (HCC)    OSA on CPAP    Pre-diabetes    Seizures (HCC)     PAST SURGICAL HISTORY: Past Surgical History:  Procedure Laterality Date   ADENOIDECTOMY     INNER EAR SURGERY      FAMILY HISTORY: Family History  Problem Relation Age of Onset   Healthy Mother    Other Father        unsure of health status   Diabetes Paternal Grandmother     SOCIAL HISTORY: Social History   Socioeconomic History   Marital status: Single    Spouse name: Not on file   Number of children: 0   Years of education: 12   Highest education level: High school graduate  Occupational History   Occupation: Disabled  Tobacco Use   Smoking status: Light Smoker    Pack years: 0.00   Smokeless tobacco: Never  Vaping Use   Vaping Use: Never used  Substance and Sexual Activity   Alcohol use: No   Drug use: No   Sexual activity: Not on file  Other Topics Concern   Not on file  Social History Narrative   Lives at home with his mother.    Left-handed.   Occasional caffeine use.   Social Determinants of Health   Financial Resource Strain: Not on file  Food Insecurity: Not on file  Transportation Needs: Not on file  Physical Activity: Not on file  Stress: Not on file  Social Connections: Not on file  Intimate Partner Violence: Not on file   PHYSICAL EXAM  Vitals:   05/21/21 1245  BP: 132/78  Pulse: (!) 51  Weight: 274 lb (124.3 kg)  Height: 6\' 2"  (1.88 m)    Body mass index is 35.18 kg/m.  Generalized: Well developed, in no acute distress   Neurological examination  Mentation: Alert oriented to time, place, history taking. Follows all commands speech  and language fluent Cranial nerve II-XII: Pupils were equal round reactive to light. Extraocular movements were full, visual field were full on confrontational test. Facial sensation and strength were normal.  Head turning and shoulder shrug  were normal and symmetric. Motor: The motor testing reveals 5 over 5 strength of all 4 extremities. Good symmetric motor tone is noted throughout.  Sensory: Sensory testing is intact to soft touch on all 4 extremities. No evidence of extinction is noted.  Coordination: Cerebellar testing reveals good finger-nose-finger and heel-to-shin bilaterally.  Gait and station: Gait is normal.  Reflexes: Deep tendon reflexes are symmetric and normal bilaterally.   DIAGNOSTIC DATA (LABS, IMAGING, TESTING) - I reviewed patient records, labs, notes, testing and imaging myself where available.  Lab Results  Component Value Date   WBC 10.5 01/24/2021   HGB 14.7 01/24/2021   HCT 45.5 01/24/2021   MCV 81.3 01/24/2021   PLT 276 01/24/2021      Component Value Date/Time   NA 137 01/24/2021 0158   NA 137 07/09/2020 1659   K 3.4 (L) 01/24/2021 0158   CL 102 01/24/2021 0158   CO2 26 01/24/2021 0158   GLUCOSE 105 (H) 01/24/2021 0158   BUN 13 01/24/2021 0158   BUN 10 07/09/2020 1659   CREATININE 1.21 01/24/2021 0158   CALCIUM 9.1  01/24/2021 0158   PROT 7.9 07/09/2020 1659   ALBUMIN 4.8 07/09/2020 1659   AST 18 07/09/2020 1659   ALT 13 07/09/2020 1659   ALKPHOS 82 07/09/2020 1659   BILITOT 0.5 07/09/2020 1659   GFRNONAA >60 01/24/2021 0158   GFRAA 90 07/09/2020 1659   Lab Results  Component Value Date   CHOL 107 07/09/2020   HDL 36 (L) 07/09/2020   LDLCALC 47 07/09/2020   TRIG 138 07/09/2020   CHOLHDL 3.0 07/09/2020   Lab Results  Component Value Date   HGBA1C 6.2 (H) 07/09/2020   No results found for: [id number] Lab Results  Component Value Date   TSH 0.530 09/18/2019   ASSESSMENT AND PLAN 25 y.o. year old male  has a past medical history of Aspergers' syndrome, Autism, Bipolar 1 disorder (HCC), OSA on CPAP, Pre-diabetes, and Seizures (HCC). here with:  1.  Seizures 2.  Mood disorder  -Denies any recent seizures, reportedly doing well, is here alone today, only on Lamictal and trazodone, seeing psych -Continue Lamictal XR 300 mg at bedtime  -Check routine labs today, Lamictal level to ensure compliance -Call for seizures, otherwise follow-up 1 year or sooner if needed  Margie Ege, AGNP-C, DNP 05/21/2021, 1:12 PM [facility] [address] [place], [place] [address] [phone]

## 2021-09-17 ENCOUNTER — Ambulatory Visit: Payer: Self-pay | Admitting: *Deleted

## 2021-09-17 NOTE — Telephone Encounter (Signed)
Scheduled appointment with Dr. Laural Benes.

## 2021-09-17 NOTE — Telephone Encounter (Signed)
Pt reports at appt with counselor today, advised to call PCP as HR 52. Last appt with neuro 05/21/21 noted HR 51. Pt states "I was surprised by this because I feel like my heart races at times."  States onset at least 2 months ago. Denies palpitations, "Just feels like my heart speeds up, then goes back down."  Denies dizziness, no CP or tightness, denies anxiety when occurs. Does report SOB at times "But I smoke."  Attempted to secure appt with regards to pt's schedule. Pt requesting appt next Friday, "Anytime." States has other appts during week and transportation issues. Assured pt NT would route to practice for PCPs review and final disposition. Care advise given per protocol. Advised ED for CP, palpitations, SOB worsens, any worsening symptoms. Pt verbalizes understanding. Please advsie: [phone]      Reason for Disposition  Palpitations are a chronic symptom (recurrent or ongoing AND present > 4 weeks)    No Palpitations, HR slow, Feels like speeds up  Answer Assessment - Initial Assessment Questions 1. DESCRIPTION: "Please describe your heart rate or heartbeat that you are having" (e.g., fast/slow, regular/irregular, skipped or extra beats, "palpitations")     52 at appt today counselor. 2. ONSET: "When did it start?" (Minutes, hours or days)      At appt today 3. DURATION: "How long does it last" (e.g., seconds, minutes, hours)     5 minutes 4. PATTERN "Does it come and go, or has it been constant since it started?"  "Does it get worse with exertion?"   "Are you feeling it now?"     Intermittent, not daily 5. TAP: "Using your hand, can you tap out what you are feeling on a chair or table in front of you, so that I can hear?" (Note: not all patients can do this)       no 6. HEART RATE: "Can you tell me your heart rate?" "How many beats in 15 seconds?"  (Note: not all patients can do this)       no 7. RECURRENT SYMPTOM: "Have you ever had this before?" If Yes, ask: "When was the last  time?" and "What happened that time?"      Unsure 8. CAUSE: "What do you think is causing the palpitations?"    None 9. CARDIAC HISTORY: "Do you have any history of heart disease?" (e.g., heart attack, angina, bypass surgery, angioplasty, arrhythmia)      no 10. OTHER SYMPTOMS: "Do you have any other symptoms?" (e.g., dizziness, chest pain, sweating, difficulty breathing)      SOB at rest, with exertion, onset 2 or more months.  Protocols used: Heart Rate and Heartbeat Questions-A-AH

## 2021-10-09 ENCOUNTER — Ambulatory Visit: Payer: Medicaid Other | Attending: Internal Medicine | Admitting: Internal Medicine

## 2022-01-26 ENCOUNTER — Ambulatory Visit: Payer: Medicaid Other | Admitting: Internal Medicine

## 2022-03-10 ENCOUNTER — Ambulatory Visit: Payer: Medicaid Other | Attending: Internal Medicine | Admitting: Internal Medicine

## 2022-03-10 VITALS — BP 119/76 | HR 52 | Temp 97.7°F | Wt 265.0 lb

## 2022-03-10 DIAGNOSIS — Z87891 Personal history of nicotine dependence: Secondary | ICD-10-CM | POA: Insufficient documentation

## 2022-03-10 DIAGNOSIS — G40409 Other generalized epilepsy and epileptic syndromes, not intractable, without status epilepticus: Secondary | ICD-10-CM | POA: Diagnosis present

## 2022-03-10 DIAGNOSIS — G4733 Obstructive sleep apnea (adult) (pediatric): Secondary | ICD-10-CM | POA: Diagnosis not present

## 2022-03-10 DIAGNOSIS — R7303 Prediabetes: Secondary | ICD-10-CM

## 2022-03-10 DIAGNOSIS — Z114 Encounter for screening for human immunodeficiency virus [HIV]: Secondary | ICD-10-CM

## 2022-03-10 DIAGNOSIS — E669 Obesity, unspecified: Secondary | ICD-10-CM | POA: Diagnosis not present

## 2022-03-10 DIAGNOSIS — G40909 Epilepsy, unspecified, not intractable, without status epilepticus: Secondary | ICD-10-CM | POA: Diagnosis not present

## 2022-03-10 DIAGNOSIS — F319 Bipolar disorder, unspecified: Secondary | ICD-10-CM | POA: Diagnosis not present

## 2022-03-10 DIAGNOSIS — Z6834 Body mass index (BMI) 34.0-34.9, adult: Secondary | ICD-10-CM | POA: Insufficient documentation

## 2022-03-10 DIAGNOSIS — Z638 Other specified problems related to primary support group: Secondary | ICD-10-CM | POA: Diagnosis not present

## 2022-03-10 DIAGNOSIS — Z79899 Other long term (current) drug therapy: Secondary | ICD-10-CM | POA: Insufficient documentation

## 2022-03-10 DIAGNOSIS — F17200 Nicotine dependence, unspecified, uncomplicated: Secondary | ICD-10-CM

## 2022-03-10 DIAGNOSIS — E66811 Obesity, class 1: Secondary | ICD-10-CM

## 2022-03-10 DIAGNOSIS — Z833 Family history of diabetes mellitus: Secondary | ICD-10-CM | POA: Diagnosis not present

## 2022-03-10 DIAGNOSIS — Z1159 Encounter for screening for other viral diseases: Secondary | ICD-10-CM

## 2022-03-10 MED ORDER — LAMOTRIGINE ER 300 MG PO TB24: 300.0000 mg | ORAL_TABLET | Freq: Every day | ORAL | 4 refills | Status: AC

## 2022-03-10 MED ORDER — TRAZODONE HCL 50 MG PO TABS: ORAL_TABLET | ORAL | 3 refills | Status: AC

## 2022-03-10 NOTE — Progress Notes (Addendum)
? ? ?Patient ID: Richard Ellison, male    DOB: 1996-06-16  MRN: [id number] ? ?CC: paperwork ? ? ?Subjective: ?Richard Ellison is a 26 y.o. 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 ?Not getting along with his mother and his sister so his coordinator at [facility] trying to help him get into assisted living.  Mainly requires help with medication management.  Has FL2 form to be completed. ?Followed at [facility] in [facility] by someone he calls Dr. B.  Last seen 1 mth ago.  She has him on a med but does not recall the name.  Out of this med x 1 mth.  Request RF on Trazodone.Hx of bipolar depression.  GAD-7 and PHQ H9 score both significantly positive today.  He answered positive for suicidal thoughts on the day screening tool but he tells me today that he is not suicidal.  He tells me that he states calm by taking long walks. ? ?SZ:  out of Trazodone and Lamotrigine for mths ?No recent sz.  Saw neurology 05/2021.  Looks like she gave him enough refill on the lamotrigine to last 1 yr ? ?OSA on CPAP:  reports using CPAP consistently and feels he benefits from it ? ?Tob BVQ:XIHWT on and off. Quit 02/25/22.  ?Reports smoking marijuana twice a wk which he states is less than before. ? ?Obesity/PreDM:  Loss 9 lbs since 05/2021.  Reports he is doing better with eating habits.  Cut back on sugar and white stuff including bread.  Also works out daily for 1 hr. ?HM: Patient agreeable to be screened for HIV and hepatitis C. ?Patient Active Problem List  ? Diagnosis Date Noted  ? Elevated blood-pressure reading without diagnosis of hypertension 07/10/2020  ? Influenza vaccination declined 07/10/2020  ? Insomnia 01/18/2020  ? Former smoker 09/18/2019  ? Bipolar affective disorder, depressed, moderate (HCC) 06/16/2019  ? Panic disorder 06/16/2019  ? OSA on  CPAP 06/16/2019  ? Tobacco dependence 06/16/2019  ? Prediabetes 06/16/2019  ? Obesity (BMI 30-39.9) 06/16/2019  ? Seizure disorder (HCC) 05/31/2018  ?  ? ?No current outpatient medications on file prior to visit.  ? ?No current facility-administered medications on file prior to visit.  ? ? ?No Known Allergies ? ?Social History  ? ?Socioeconomic History  ? Marital status: Single  ?  Spouse name: Not on file  ? Number of children: 0  ? Years of education: 41  ? Highest education level: High school graduate  ?Occupational History  ? Occupation: Disabled  ?Tobacco Use  ? Smoking status: Light Smoker  ? Smokeless tobacco: Never  ?Vaping Use  ? Vaping Use: Never used  ?Substance and Sexual Activity  ? Alcohol use: No  ? Drug use: No  ? Sexual activity: Not on file  ?Other Topics Concern  ? Not on file  ?Social History Narrative  ? Lives at home with his mother.  ? Left-handed.  ? Occasional caffeine use.  ? ?Social Determinants of Health  ? ?Financial Resource Strain: Not on file  ?Food Insecurity: Not on file  ?Transportation Needs: Not on file  ?Physical Activity: Not on file  ?Stress: Not on file  ?Social Connections: Not on file  ?Intimate Partner Violence: Not on file  ? ? ?Family History  ?Problem Relation Age of Onset  ? Healthy Mother   ?  Other Father   ?     unsure of health status  ? Diabetes Paternal Grandmother   ? ? ?Past Surgical History:  ?Procedure Laterality Date  ? ADENOIDECTOMY    ? INNER EAR SURGERY    ? ? ?ROS: ?Review of Systems ?Negative except as stated above ? ?PHYSICAL EXAM: ?BP 119/76   Pulse (!) 52   Temp 97.7 ?F (36.5 ?C) (Oral)   Wt 265 lb (120.2 kg)   SpO2 100%   BMI 34.02 kg/m?   ?Wt Readings from Last 3 Encounters:  ?03/10/22 265 lb (120.2 kg)  ?05/21/21 274 lb (124.3 kg)  ?01/24/21 285 lb (129.3 kg)  ? ? ?Physical Exam ? ?General appearance - alert, well appearing, young AAM and in no distress ?Mental status -patient is forgetful.  He gets frustrated easily when trying to get his  thoughts out.   ?Nose - normal and patent, no erythema, discharge or polyps ?Mouth - mucous membranes moist, pharynx normal without lesions ?Neck - supple, no significant adenopathy ?Chest - clear to auscultation, no wheezes, rales or rhonchi, symmetric air entry ?Heart - normal rate, regular rhythm, normal S1, S2, no murmurs, rubs, clicks or gallops ?Extremities - peripheral pulses normal, no pedal edema, no clubbing or cyanosis ? ? ?  03/10/2022  ?  1:54 PM 07/09/2020  ?  3:59 PM 06/16/2019  ?  3:57 PM  ?Depression screen PHQ 2/9  ?Decreased Interest 3 3 2   ?Down, Depressed, Hopeless 3 3 2   ?PHQ - 2 Score 6 6 4   ?Altered sleeping 3 1 1   ?Tired, decreased energy 3 3 2   ?Change in appetite 3 2 1   ?Feeling bad or failure about yourself  3 3 3   ?Trouble concentrating 3 3 1   ?Moving slowly or fidgety/restless 3 3 1   ?Suicidal thoughts 3 3 3   ?PHQ-9 Score 27 24 16   ? ? ?  03/10/2022  ?  1:55 PM 07/09/2020  ?  3:59 PM 06/16/2019  ?  3:57 PM  ?GAD 7 : Generalized Anxiety Score  ?Nervous, Anxious, on Edge 3 3 3   ?Control/stop worrying 3 3 2   ?Worry too much - different things 3 3 3   ?Trouble relaxing 3 3 2   ?Restless 3 3 2   ?Easily annoyed or irritable 3 3 2   ?Afraid - awful might happen 3 3 3   ?Total GAD 7 Score 21 21 17   ? ? ? ? ? ?  Latest Ref Rng & Units 01/24/2021  ?  1:58 AM 07/09/2020  ?  4:59 PM 06/16/2019  ?  4:42 PM  ?CMP  ?Glucose 70 - 99 mg/dL 841105   324100   92    ?BUN 6 - 20 mg/dL 13   10   12     ?Creatinine 0.61 - 1.24 mg/dL 4.011.21   0.271.27   2.531.21    ?Sodium 135 - 145 mmol/L 137   137   142    ?Potassium 3.5 - 5.1 mmol/L 3.4   4.4   4.4    ?Chloride 98 - 111 mmol/L 102   101   103    ?CO2 22 - 32 mmol/L 26   26   24     ?Calcium 8.9 - 10.3 mg/dL 9.1   9.6   9.2    ?Total Protein 6.0 - 8.5 g/dL  7.9   7.8    ?Total Bilirubin 0.0 - 1.2 mg/dL  0.5   0.2    ?Alkaline Phos 48 - 121 IU/L  82   73    ?AST 0 - 40 IU/L  18   25    ?ALT 0 - 44 IU/L  13   18    ? ?Lipid Panel  ?   ?Component Value Date/Time  ? CHOL 107 07/09/2020 1659  ?  TRIG 138 07/09/2020 1659  ? HDL 36 (L) 07/09/2020 1659  ? CHOLHDL 3.0 07/09/2020 1659  ? LDLCALC 47 07/09/2020 1659  ? ? ?CBC ?   ?Component Value Date/Time  ? WBC 10.5 01/24/2021 0158  ? RBC 5.60 01/24/2021 0158  ? HGB 14.7 01/24/2021 0158  ? HGB 14.2 07/09/2020 1659  ? HCT 45.5 01/24/2021 0158  ? HCT 44.8 07/09/2020 1659  ? PLT 276 01/24/2021 0158  ? PLT 271 07/09/2020 1659  ? MCV 81.3 01/24/2021 0158  ? MCV 81 07/09/2020 1659  ? MCH 26.3 01/24/2021 0158  ? MCHC 32.3 01/24/2021 0158  ? RDW 13.2 01/24/2021 0158  ? RDW 13.6 07/09/2020 1659  ? LYMPHSABS 2.6 01/24/2021 0158  ? MONOABS 0.6 01/24/2021 0158  ? EOSABS 0.1 01/24/2021 0158  ? BASOSABS 0.0 01/24/2021 0158  ? ? ?ASSESSMENT AND PLAN: ?1. Seizure disorder (HCC) ?Not consistent with taking his medication.  Encouraged him to take it consistently.  I told him that he has refills on the prescription that was given by the neurologist in July of last year but just in case I have sent an updated prescription. ?- LamoTRIgine 300 MG TB24 24 hour tablet; Take 1 tablet (300 mg total) by mouth at bedtime.  Dispense: 180 tablet; Refill: 4 ? ?2. Tobacco dependence ?Commended him on quitting.  Encouraged him to remain tobacco free. ? ?3. Obesity (BMI 30.0-34.9) ?4. Prediabetes ?Commended him on weight loss so far.  Encouraged him to continue regular exercise.  Continue trying to eat healthy. ?Patient advised to eliminate sugary drinks from the diet, cut back on portion sizes especially of white carbohydrates, eat more white lean meat like chicken [place] and seafood instead of beef or pork and incorporate fresh fruits and vegetables into the diet daily. ? ?- Hemoglobin A1c ? ?5. Bipolar depression (HCC) ?Advised patient that if he has active suicidal thoughts, he should call his mental health provider or be seen in the emergency room.  Request that he calls me back with the name of the medication that his psychiatrist has him on.  Refill sent on trazodone. ?Encourage f/u with  his mental health provider. ? ?6.  Stress due to family tension. ?-I will complete FL 2 form and fax it back to his case worker Brigitte Pulse, LCSW. ?Main thing that he needs assistance with is medication managem

## 2022-03-10 NOTE — Patient Instructions (Addendum)
Please call me back with the name of the medication that your psychiatrist has you on. ?I have sent refills on Trazodone and your seizure medication to your pharmacy. ?Keep follow up appointment with your psychiatrist. ?

## 2022-03-10 NOTE — Progress Notes (Signed)
Needs a RF on medications ?Off medications x 1 month ?

## 2022-03-11 LAB — HEPATITIS C ANTIBODY: Hep C Virus Ab: NONREACTIVE

## 2022-03-11 LAB — HEMOGLOBIN A1C
Est. average glucose Bld gHb Est-mCnc: 111 mg/dL
Hgb A1c MFr Bld: 5.5 % (ref 4.8–5.6)

## 2022-03-11 LAB — HIV ANTIBODY (ROUTINE TESTING W REFLEX): HIV Screen 4th Generation wRfx: NONREACTIVE

## 2022-03-12 ENCOUNTER — Telehealth: Payer: Self-pay | Admitting: Internal Medicine

## 2022-03-12 NOTE — Telephone Encounter (Signed)
Copied from [place] [phone]. Topic: General - Other >> Mar 12, 2022 10:19 AM Alanda Slim E wrote: Reason for CRM: pt is calling to see if fl2 form was sent to [facility] / please advise when sent

## 2022-03-12 NOTE — Telephone Encounter (Signed)
Copied from [phone]. Topic: General - Other >> Mar 12, 2022 10:19 AM Alanda Slim E wrote: Reason for CRM: pt is calling to see if fl2 form was sent to [facility] / please advise when sent

## 2022-03-12 NOTE — Telephone Encounter (Signed)
Erskine Squibb would you have an idea if form was done  ?

## 2022-03-12 NOTE — Telephone Encounter (Signed)
Call placed to [facility], LCSW/[place] [facility] program [phone]. Need to confirm the recommended level of care for FL2. Should it be domiciliary (rest home)? ?Message left with call back requested to this CM. ? ?Call returned to patient's mother and informed her that the form will be faxed to [facility] as soon as the recommended level of care is confirmed ?

## 2022-03-12 NOTE — Telephone Encounter (Signed)
Call received from [facility], LCSW who confirmed that the recommended level of care is domiciliary.  ?The completed form was then faxed to [facility] as requested # [phone]. ?

## 2022-05-21 ENCOUNTER — Encounter: Payer: Self-pay | Admitting: Neurology

## 2022-05-21 ENCOUNTER — Ambulatory Visit: Payer: Medicaid Other | Admitting: Neurology

## 2022-05-21 NOTE — Progress Notes (Deleted)
PATIENT: Richard Ellison DOB: 1996/09/29  REASON FOR VISIT: follow up HISTORY FROM: patient Primary Neurologist: Dr.Yan   HISTORY Richard Ellison is a 26 years old male, seen in request by his primary care nurse practitioner Quincy Carnes for evaluation of seizure, is accompanied by his mother [name] at today's visit, initial evaluation was on May 31, 2018.   I reviewed and summarized the referring note, he had a history of schizoaffective disorder, bipolar type, obstructive sleep apnea, epilepsy, prediabetes, on polypharmacy treatment, this including Dilantin 200 mg three times a day, Seroquel 100 mg once daily, 200 mg at night, fluoxetine 20 mg in the morning, lamotrigine 100 mg twice a day, Latuda 20 mg 2 tablets at bedtime, Metformin 500 mg 1 tablets daily,   He had a history of seizures since infant 2 months old, previously was  under the care of pediatric neurologist Dr. Sharene Skeans until 2014, he has generalized tonic-clonic seizure, also had seizures of staring, freezing up, then snapped out of it.   He been treated with stable dose of Dilantin 200 mg twice a day,  he has been on Dilantin 400 mg daily since 2012, along with lamotrigine.  He has seizure every few months, last generalized tonic-clonic seizure was in 2016.   He recently switched to his primary care's office, who has followed up his Dilantin level, Dilantin dose was adjusted based on the level, up to 600 mg daily, while  he was he was taking 200/400 mg of Dilantin, he shows signs of toxicity, imbalance, dizziness, level was 26, later the dosage was adjusted to 200 mg 3 times daily, lamotrigine 100 mg twice a day was stopped since May 16, 2018,   He is now on single antiepileptic medications Dilantin ER 100 mg 2 tablets 3 times a day.   CT head without contrast in December 2015 was normal.   UPDATE Sept 9 2019: EEG in July 2018 showed mild generalized slowing, indicating mild bihemisphere no function.   He is  taking lamotrigine 150mg  bid mistakenly with lamotrigine 25 mg twice a day as well, dilantin dose was decreased to 148m 2 tabs qhs, he has no recurrent seizure, no longer has excessive daytime sleepiness, drowsiness,   He continues to suffer significant mood disorder, is receiving adjustment of his Seroquel dosage by his psychiatrist.   Mar 23, 2019 SS: Richard Ellison 26 year old male with history of seizures. Has tapered off Dilantin. Is currently taking lamotrigine extended release 300 mg, 1 tablet at bedtime.  He reports he is tolerating the Lamictal well, denies any trouble with his gait or balance   He has been doing well.  He had any recurrent seizures.  He reports he did have a panic attack one time when he went to the grocery store, he recognized it was coming, when outside in the car.   Unfortunately his primary care office, [facility] recently shut down without much notice.  They were prescribing him a prescription for Prozac, trazodone, Seroquel.  He started with a new psychiatrist at [facility].  They would not prescribe his medications, said primary care would have to do so.  He reports he only has a few days left.  Update November 30, 2019 SS: He reports he has been doing well, remains on Lamictal XR 300 mg tablet daily, reports compliance with medication.  He has not had recurrent seizure since 2016.  He is tolerating meds without side effect.  He is now established with  a new primary care doctor.  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  Lamictal level 06/16/2019 was < 1.  Update May 21, 2021 SS: Here today alone, trying to make better life choices, on spiritual journey, weight loss, lost 40 lbs, made diet modifications. No seizures. Taking Lamictal XR 300 mg at bedtime, seeing psychiatry, at [facility] in [facility]. Off Seroquel now, just on trazodone. Mood has been good. Living with mom, taking a break from  school at [employer], too much anxiety. Doesn't drive. Came by Medicaid transportation today. Using CPAP. Last seizure was summer 2016 reportedly.   Update May 21, 2022 SS:   REVIEW OF SYSTEMS: Out of a complete 14 system review of symptoms, the patient complains only of the following symptoms, and all other reviewed systems are negative.  See HPI  ALLERGIES: No Known Allergies  HOME MEDICATIONS: Outpatient Medications Prior to Visit  Medication Sig Dispense Refill   LamoTRIgine 300 MG TB24 24 hour tablet Take 1 tablet (300 mg total) by mouth at bedtime. 180 tablet 4   traZODone (DESYREL) 50 MG tablet TAKE ONE TABLET BY MOUTH AT BEDTIME FOR insomnia 30 tablet 3   No facility-administered medications prior to visit.    PAST MEDICAL HISTORY: Past Medical History:  Diagnosis Date   Aspergers' syndrome    Autism    Bipolar 1 disorder (HCC)    OSA on CPAP    Pre-diabetes    Seizures (HCC)     PAST SURGICAL HISTORY: Past Surgical History:  Procedure Laterality Date   ADENOIDECTOMY     INNER EAR SURGERY      FAMILY HISTORY: Family History  Problem Relation Age of Onset   Healthy Mother    Other Father        unsure of health status   Diabetes Paternal Grandmother     SOCIAL HISTORY: Social History   Socioeconomic History   Marital status: Single    Spouse name: Not on file   Number of children: 0   Years of education: 12   Highest education level: High school graduate  Occupational History   Occupation: Disabled  Tobacco Use   Smoking status: Light Smoker   Smokeless tobacco: Never  Vaping Use   Vaping Use: Never used  Substance and Sexual Activity   Alcohol use: No   Drug use: No   Sexual activity: Not on file  Other Topics Concern   Not on file  Social History Narrative   Lives at home with his mother.   Left-handed.   Occasional caffeine use.   Social Determinants of Health   Financial Resource Strain: Not on file  Food Insecurity: Not on file   Transportation Needs: Not on file  Physical Activity: Not on file  Stress: Not on file  Social Connections: Not on file  Intimate Partner Violence: Not on file   PHYSICAL EXAM  There were no vitals filed for this visit.   There is no height or weight on file to calculate BMI.  Generalized: Well developed, in no acute distress   Neurological examination  Mentation: Alert oriented to time, place, history taking. Follows all commands speech and language fluent Cranial nerve II-XII: Pupils were equal round reactive to light. Extraocular movements were full, visual field were full on confrontational test. Facial sensation and strength were normal.  Head turning and shoulder shrug  were normal and symmetric. Motor: The  motor testing reveals 5 over 5 strength of all 4 extremities. Good symmetric motor tone is noted throughout.  Sensory: Sensory testing is intact to soft touch on all 4 extremities. No evidence of extinction is noted.  Coordination: Cerebellar testing reveals good finger-nose-finger and heel-to-shin bilaterally.  Gait and station: Gait is normal.  Reflexes: Deep tendon reflexes are symmetric and normal bilaterally.   DIAGNOSTIC DATA (LABS, IMAGING, TESTING) - I reviewed patient records, labs, notes, testing and imaging myself where available.  Lab Results  Component Value Date   WBC 10.5 01/24/2021   HGB 14.7 01/24/2021   HCT 45.5 01/24/2021   MCV 81.3 01/24/2021   PLT 276 01/24/2021      Component Value Date/Time   NA 137 01/24/2021 0158   NA 137 07/09/2020 1659   K 3.4 (L) 01/24/2021 0158   CL 102 01/24/2021 0158   CO2 26 01/24/2021 0158   GLUCOSE 105 (H) 01/24/2021 0158   BUN 13 01/24/2021 0158   BUN 10 07/09/2020 1659   CREATININE 1.21 01/24/2021 0158   CALCIUM 9.1 01/24/2021 0158   PROT 7.9 07/09/2020 1659   ALBUMIN 4.8 07/09/2020 1659   AST 18 07/09/2020 1659   ALT 13 07/09/2020 1659   ALKPHOS 82 07/09/2020 1659   BILITOT 0.5 07/09/2020 1659    GFRNONAA >60 01/24/2021 0158   GFRAA 90 07/09/2020 1659   Lab Results  Component Value Date   CHOL 107 07/09/2020   HDL 36 (L) 07/09/2020   LDLCALC 47 07/09/2020   TRIG 138 07/09/2020   CHOLHDL 3.0 07/09/2020   Lab Results  Component Value Date   HGBA1C 5.5 03/10/2022   No results found for: "VITAMINB12" Lab Results  Component Value Date   TSH 0.530 09/18/2019   ASSESSMENT AND PLAN 26 y.o. year old male  has a past medical history of Aspergers' syndrome, Autism, Bipolar 1 disorder (HCC), OSA on CPAP, Pre-diabetes, and Seizures (HCC). here with:  1.  Seizures 2.  Mood disorder  -Denies any recent seizures, reportedly doing well, is here alone today, only on Lamictal and trazodone, seeing psych -Continue Lamictal XR 300 mg at bedtime  -Check routine labs today, Lamictal level to ensure compliance -Call for seizures, otherwise follow-up 1 year or sooner if needed  Otila Kluver, DNP 05/21/2022, 7:47 AM [facility] [address] [place], [place] [address] [phone]

## 2022-08-23 IMAGING — DX DG CHEST 2V
2 series · 2 of 2 positions shown · non-contrast
Comparison: 01/09/2019

CLINICAL DATA: Chest pain, panic attack

EXAM:
CHEST - 2 VIEW

[chest lat]
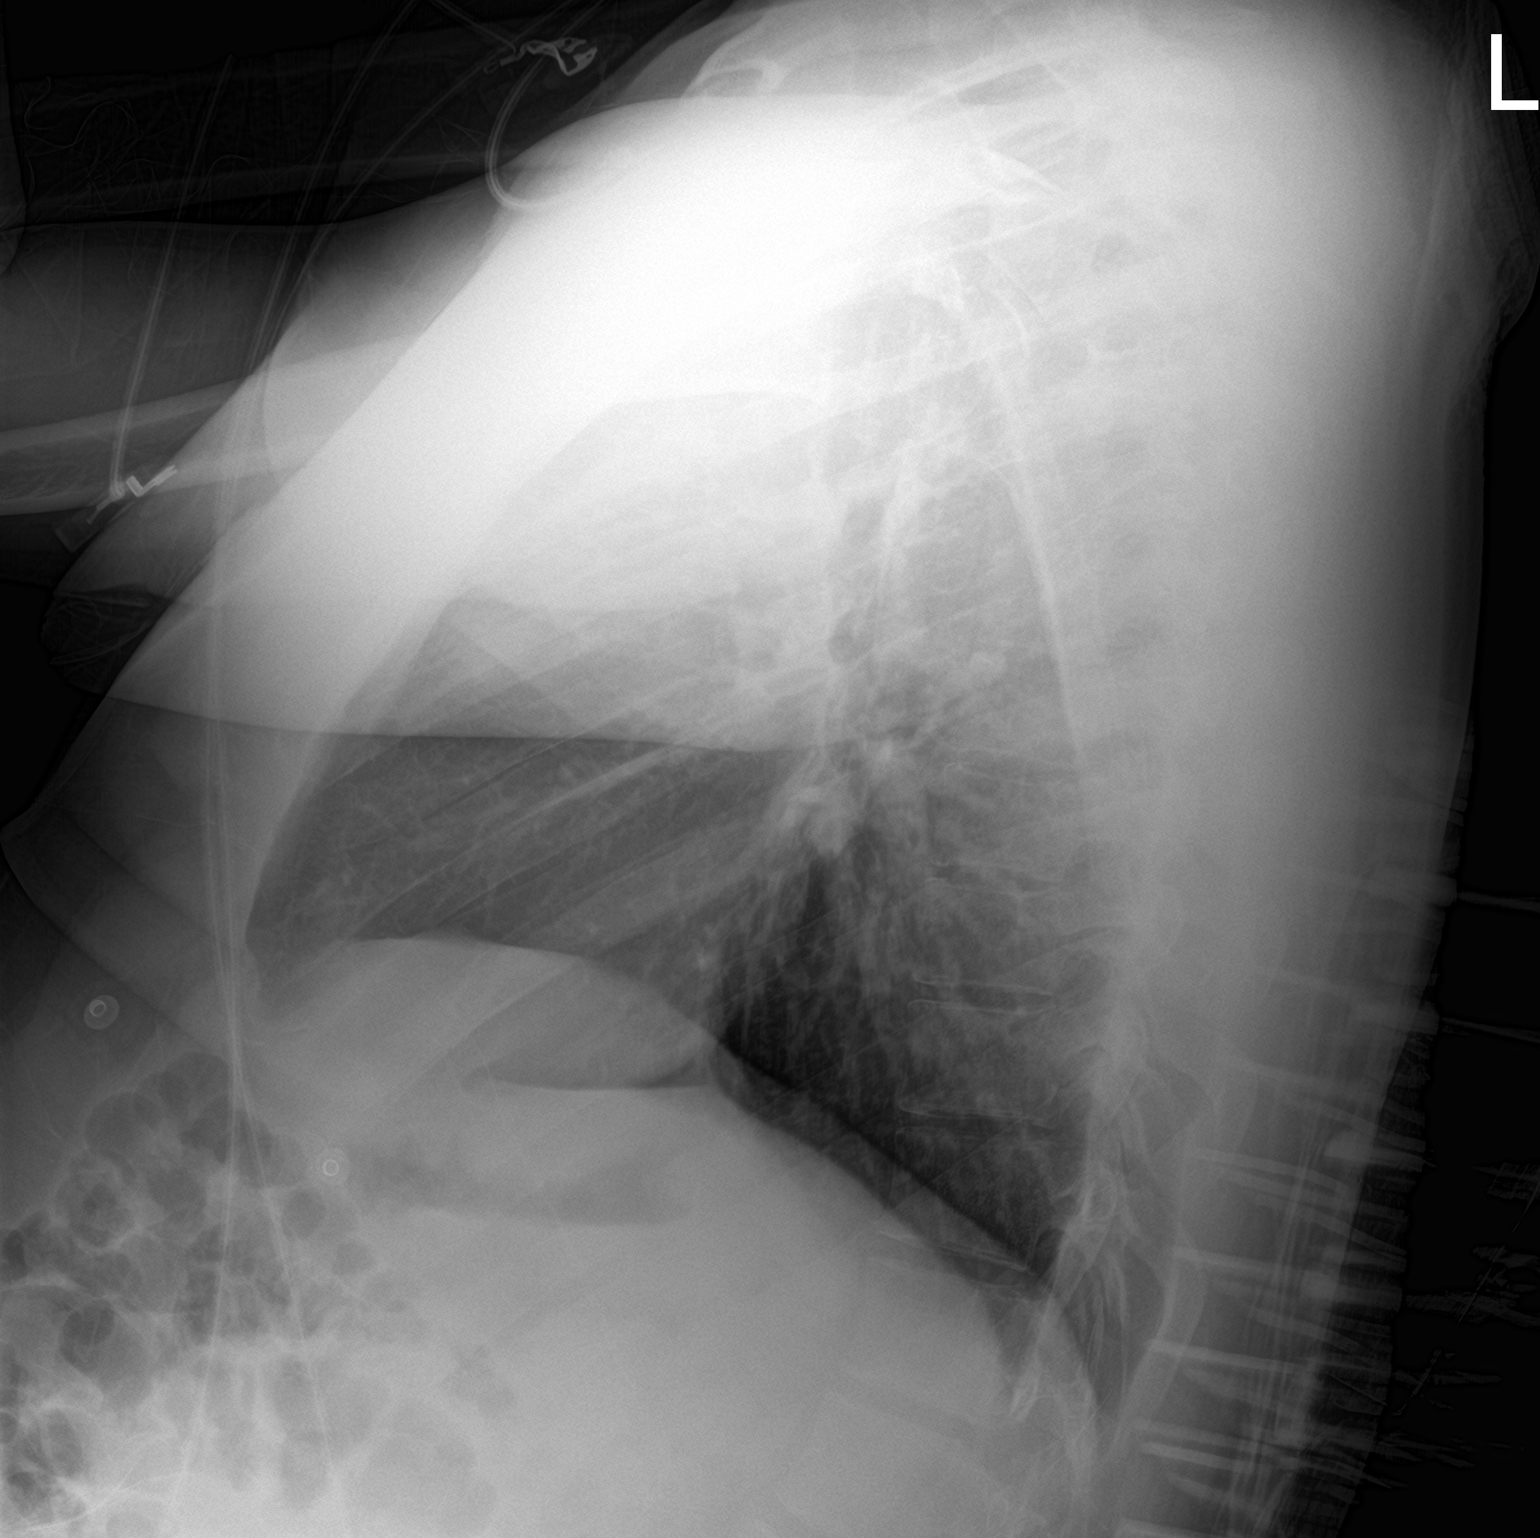

[chest ap]
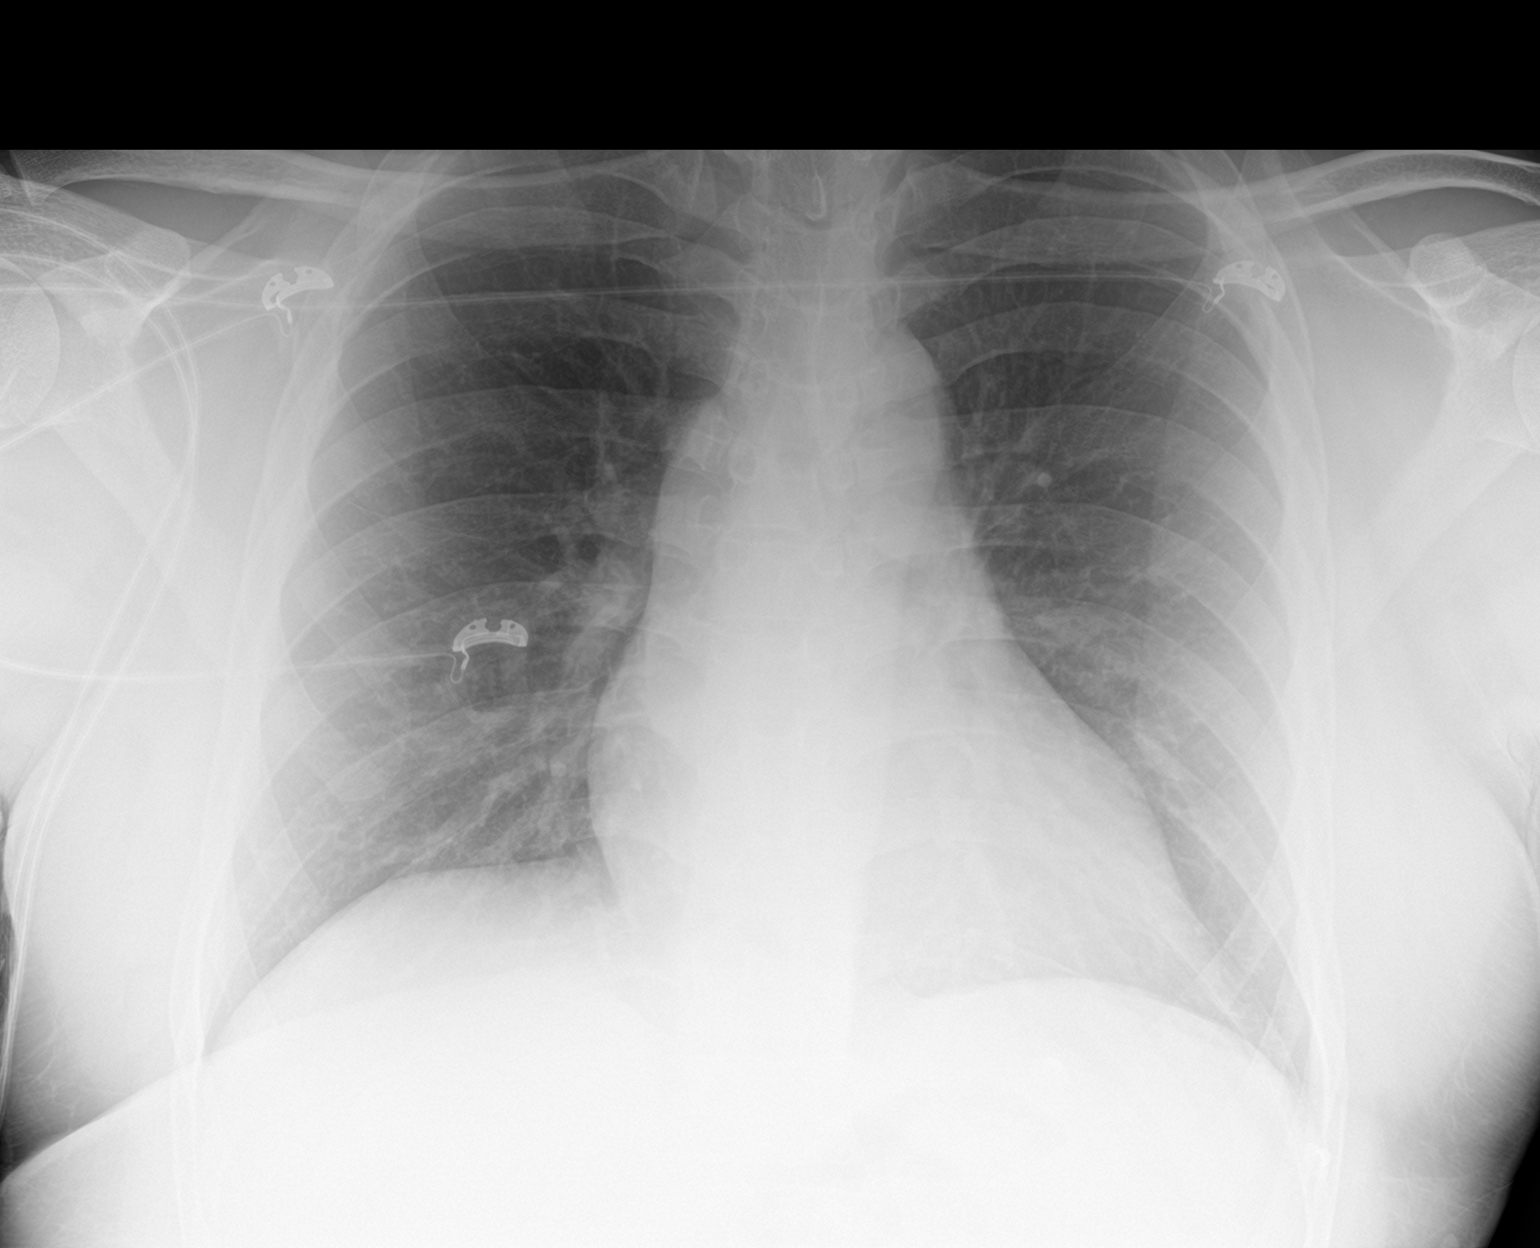

[2 of 2 positions shown; findings below may reference images not displayed]

FINDINGS: The heart size and mediastinal contours are within normal limits.
Both lungs are clear. The visualized skeletal structures are
unremarkable.
IMPRESSION: No active cardiopulmonary disease.

## 2022-08-23 IMAGING — CT CT HEAD W/O CM
3 series · 15 of 47 positions shown, 18 images · non-contrast
Comparison: CT head 10/30/2014

CLINICAL DATA: Hit head and passed out.  Headache weakness now.

EXAM:
CT HEAD WITHOUT CONTRAST
TECHNIQUE: Contiguous axial images were obtained from the base of the skull
through the vertex without intravenous contrast.

[Series 2: head wo · axial · 0.49mm/px · z∈[+734,+884]mm · 9 of 36 slices shown, 12 images]
[im 3/36  brain]
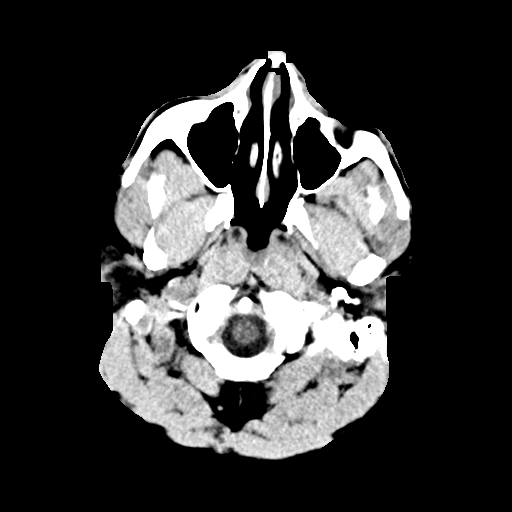
[im 3/36  bone]
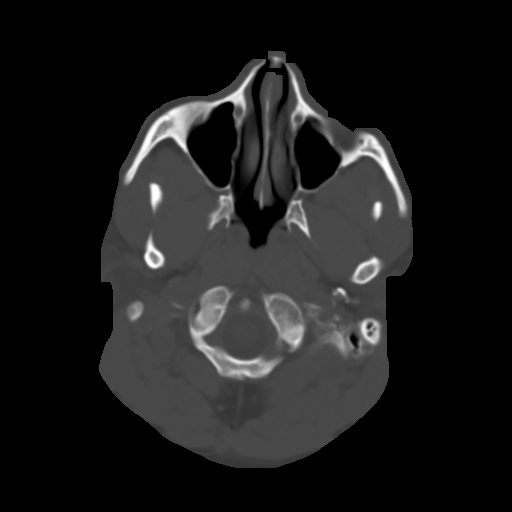
[im 7/36  brain]
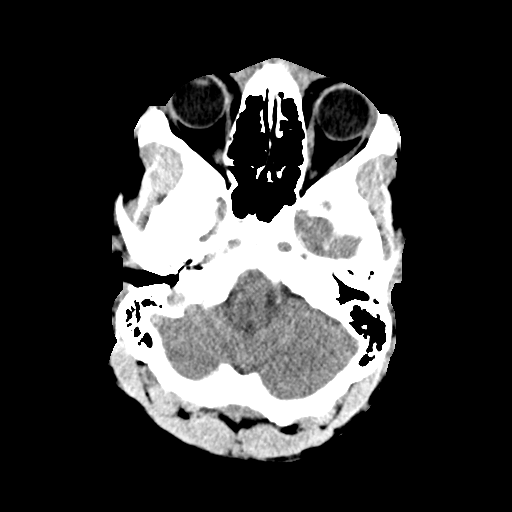
[im 10/36  brain]
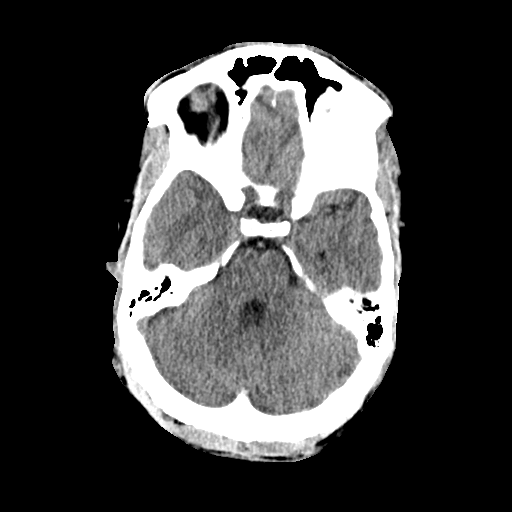
[im 14/36  brain]
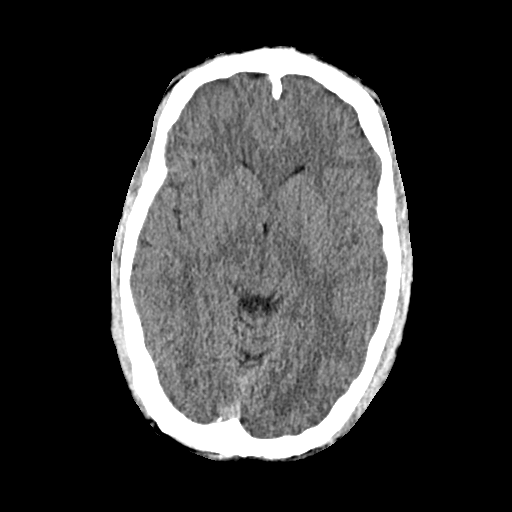
[im 19/36  brain]
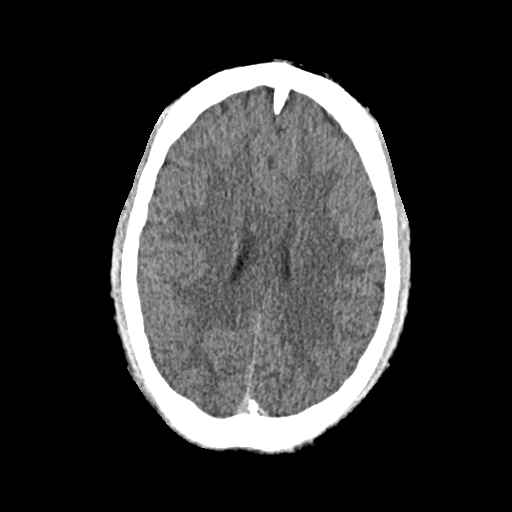
[im 19/36  bone]
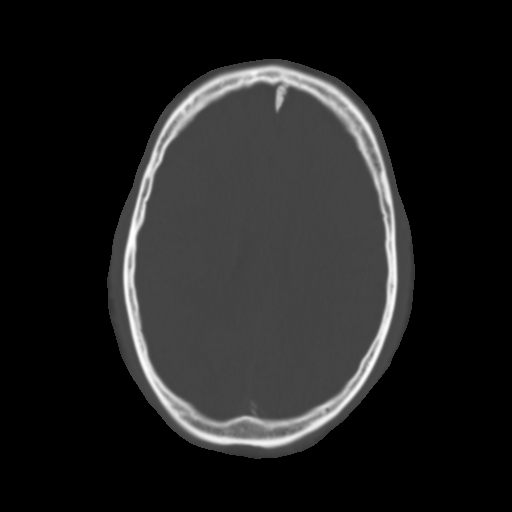
[im 22/36  brain]
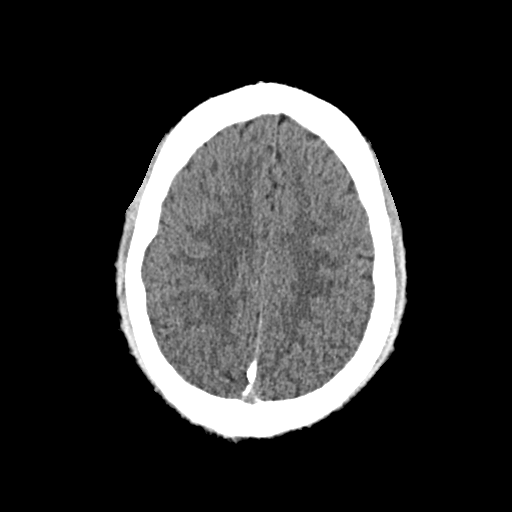
[im 26/36  brain]
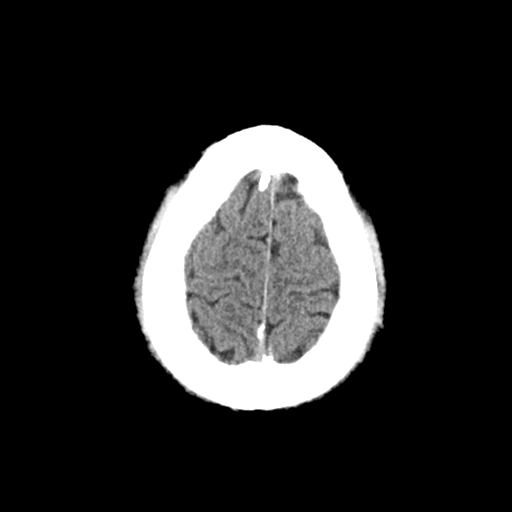
[im 29/36  brain]
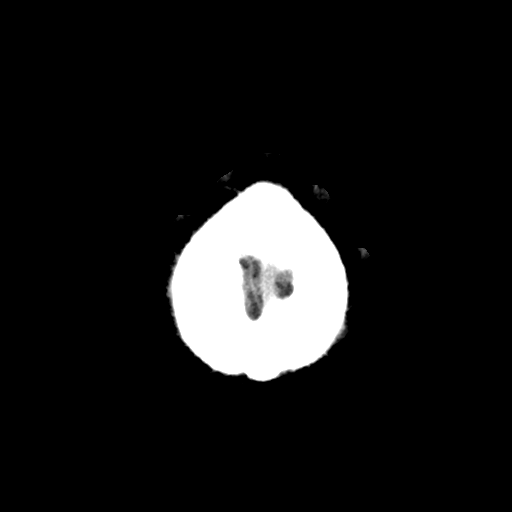
[im 33/36  brain]
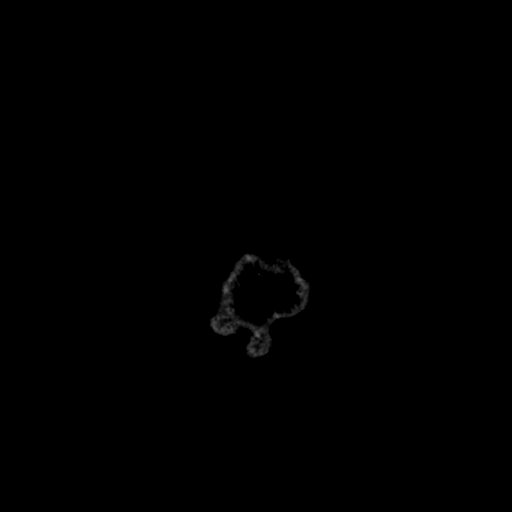
[im 33/36  bone]
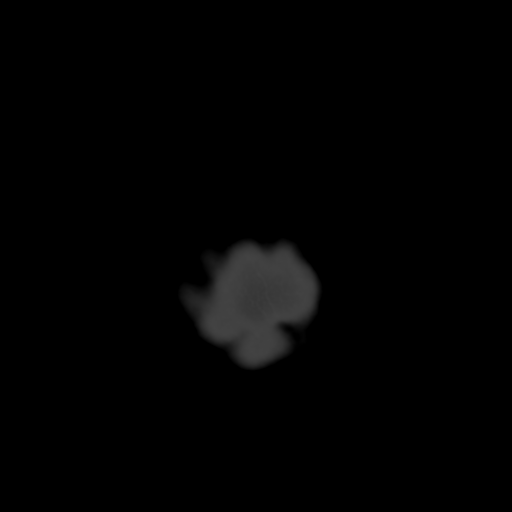

[Series 4: coronal soft · coronal · 0.35mm/px · 3 of 78 slices shown]
[im 26/78  brain]
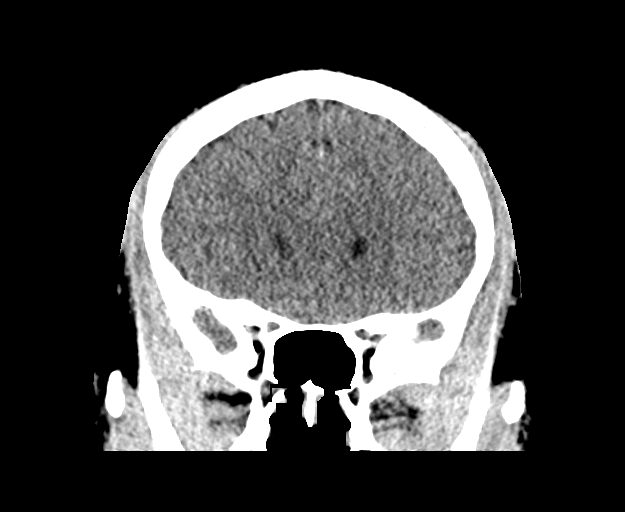
[im 35/78  brain]
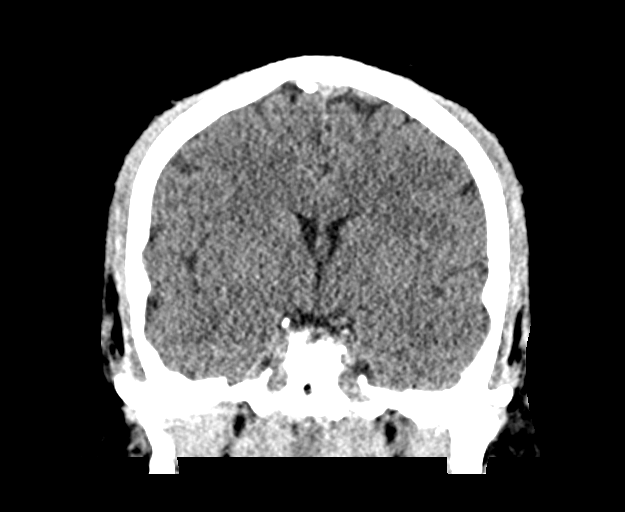
[im 43/78  brain]
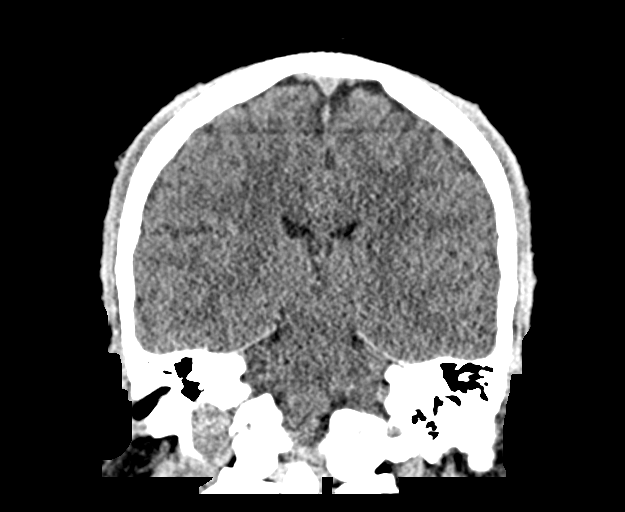

[Series 5: sag soft · sagittal · 0.36mm/px · 3 of 67 slices shown]
[im 23/67  brain]
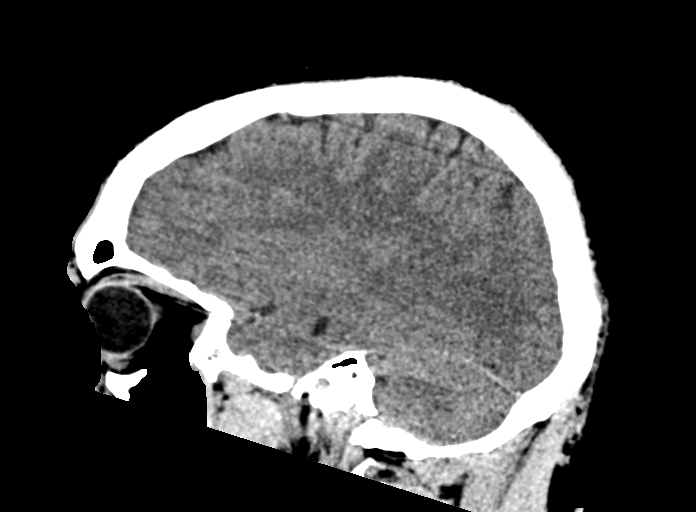
[im 34/67  brain]
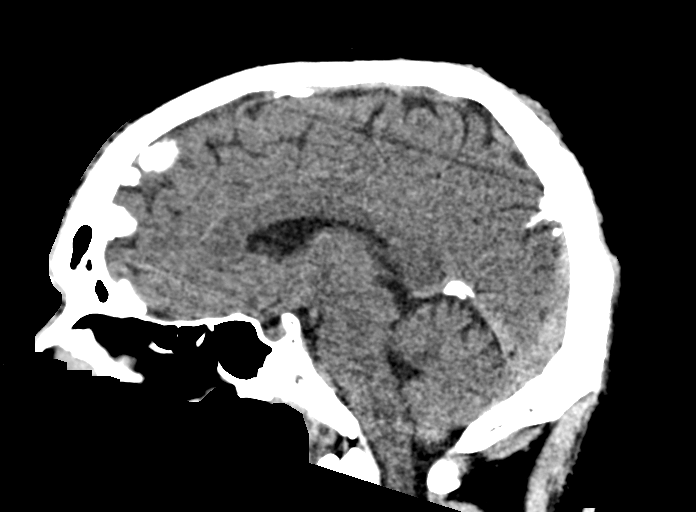
[im 45/67  brain]
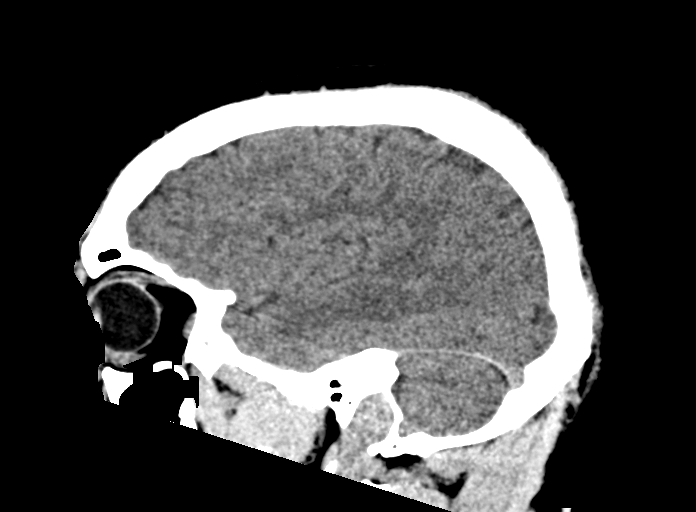

[15 of 47 positions shown; findings below may reference images not displayed]

FINDINGS: Brain:

No evidence of large-territorial acute infarction. No parenchymal
hemorrhage. No mass lesion. No extra-axial collection.

No mass effect or midline shift. No hydrocephalus. Basilar cisterns
are patent.

Vascular: No hyperdense vessel.

Skull: No acute fracture or focal lesion.

Sinuses/Orbits: Paranasal sinuses and mastoid air cells are clear.
The orbits are unremarkable.

Other: None.
IMPRESSION: No acute abnormality.

## 2022-09-10 ENCOUNTER — Ambulatory Visit: Payer: Medicaid Other | Admitting: Internal Medicine
